# Patient Record
Sex: Female | Born: 1970 | Race: White | Hispanic: No | Marital: Married | Smoking: Never smoker
Health system: Southern US, Community
[De-identification: ages and names within clinical notes are randomized; demographics above are authoritative.]

---
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
## 2007-10-16 NOTE — Unmapped (Signed)
Signed by Danton Sewer MA on 10/17/2007 at 10:41:20    Phone Note   Patient Call    Call back at Home Phone: [phone]  Caller: patient  Call for: Thedore Mins    Follow-up for Phone Call   LVM FOR PT WILL TRY AGAIN ON THURSDAY TO REACH PT  Follow-up by: Danton Sewer MA,  October 16, 2007 4:47 PM      Additional Follow-up for Phone Call   lvm for pt can give the call ctr ladies info anything they are given is kept confidential  Phone Call Completed, Called Patient  Additional Follow-up by: Danton Sewer MA,  October 17, 2007 10:41 AM      Summary of Call:  Pt would to discuss the possibility of another blood test prior to seeing the physician...?     Initial call taken by: Marinell Blight,  October 16, 2007 10:00 AM      Follow-up for Phone Call   Pt has NPV scheduled for physical, pap and labs but theres something else she wanted to discuss and wouldn't tell me.  Follow-up by: Marinell Blight,  October 16, 2007 4:16 PM

## 2007-11-01 LAB — COMPREHENSIVE METABOLIC PANEL
A/G Ratio: 1.9 (ref 1.0–2.1)
ALT: 16 units/L (ref 6–40)
AST: 20 units/L (ref 10–30)
Albumin: 4.6 g/dL (ref 3.6–5.1)
Alkaline Phosphatase: 50 units/L (ref 33–115)
BUN: 15 mg/dL (ref 7–25)
CO2: 26 mmol/L (ref 21–33)
Calcium: 9.2 mg/dL (ref 8.6–10.2)
Chloride: 103 mmol/L (ref 98–110)
Creatinine: 1 mg/dL (ref 0.58–1.06)
GFR MDRD Af Amer: >60 mL/min (ref >=60)
GFR MDRD Non Af Amer: >60 mL/min (ref >=60)
Globulin, Total: 2.4 g/dL (ref 2.2–3.9)
Glucose: 90 mg/dL (ref 65–99)
Potassium: 4.2 mmol/L (ref 3.5–5.3)
Sodium: 137 mmol/L (ref 135–146)
Total Bilirubin: 1.6 mg/dL (ref 0.2–1.2)
Total Protein: 7 g/dL (ref 6.2–8.3)

## 2007-11-01 LAB — CBC
Hematocrit: 39.2 % (ref 35.0–45.0)
Hemoglobin: 13.8 g/dL (ref 11.7–15.5)
MCH: 32.6 pg (ref 27.0–33.0)
MCHC: 35.1 g/dL (ref 32.0–36.0)
MCV: 92.9 fL (ref 80.0–100.0)
Platelets: 273 10*3/uL (ref 140–400)
RBC: 4.22 10*6/uL (ref 3.80–5.10)
RDW: 13.4 % (ref 11.0–15.0)
WBC: 3.6 10*3/uL (ref 3.8–10.8)

## 2007-11-01 LAB — LIPID PANEL
Chol/HDL Ratio: 2.2 (ref <=5.0)
Cholesterol, Total: 149 mg/dL (ref 125–200)
HDL: 67 mg/dL (ref >=46)
LDL Cholesterol: 68 mg/dL (ref <130)
Triglycerides: 70 mg/dL (ref <150)

## 2007-11-01 NOTE — Unmapped (Signed)
Signed by Cleone Slim MD on 11/04/2007 at 16:14:15  Patient: Julia Keller  Note: All result statuses are Final unless otherwise noted.    Tests: (1) TISSUE TRANSGLUTAMINASE ANTIBODY, IGA (QIO-9629)  ! TISSUE TRANSGLUTAMINASE ANTIBODY, IGA                              <3 U/mL      VALUE              EXPLANATION OF TEST RESULTS       <5                NEGATIVE      5-8                EQUIVOCAL       >8                POSITIVE    Note: An exclamation mark (!) indicates a result that was not dispersed into   the flowsheet.  Document Creation Date: 11/04/2007 1:11 PM  _______________________________________________________________________    (1) Order result status: Final  Collection or observation date-time: 11/01/2007 09:14  Requested date-time:   Receipt date-time: 11/01/2007 09:21  Reported date-time: 11/04/2007 12:00  Referring Physician:    Ordering Physician: Melissa Montane [facility])  Specimen Source: S  Source: Arline Asp Order Number: [id number]  Lab site: [facility], [facility] [place]      [address]      [place]  [place]  [address]

## 2007-11-01 NOTE — Unmapped (Signed)
Signed by Cleone Slim MD on 11/01/2007 at 17:42:20  Patient: Julia Keller  Note: All result statuses are Final unless otherwise noted.    Tests: (1) CBC (H/H, RBC, INDICES, WBC, PLT) (QDL-1759)   WHITE BLOOD CELL COUNT                         [L]  3.6 Thousand/uL             3.8-10.8    RED BLOOD CELL COUNT      4.22 Million/uL             3.80-5.10    HEMOGLOBIN                13.8 g/dL                   62.1-30.8    HEMATOCRIT                39.2 %                      35.0-45.0    MCV                       92.9 fL                     80.0-100.0    MCH                       32.6 pg                     27.0-33.0    MCHC                      35.1 g/dL                   65.7-84.6    RDW                       13.4 %                      11.0-15.0    PLATELET COUNT            273 Thousand/uL             140-400    Note: An exclamation mark (!) indicates a result that was not dispersed into   the flowsheet.  Document Creation Date: 11/01/2007 4:45 PM  _______________________________________________________________________    (1) Order result status: Final  Collection or observation date-time: 11/01/2007 09:14  Requested date-time:   Receipt date-time: 11/01/2007 09:21  Reported date-time: 11/01/2007 16:00  Referring Physician:    Ordering Physician: Melissa Montane [facility])  Specimen Source: B  Source: Arline Asp Order Number: [id number]  Lab site: [facility], [facility] [place]      [address]      [place]  [facility]  [address]      -----------------    The following lab values were dispersed to the flowsheet  with no units conversion:      WHITE BLOOD CELL COUNT, 3.6 THOUSAND/UL, (F)  expected units: 10*3/mm3    RED BLOOD CELL COUNT, 4.22 MILLION/UL, (F)  expected units: 10*6/mm3    PLATELET COUNT, 273 THOUSAND/UL, (F)  expected units: 10*3/mm3

## 2007-11-01 NOTE — Unmapped (Signed)
Signed by Cleone Slim MD on 11/12/2007 at 10:17:31    [facility]  [address]  [place], [place] [address]  Ph: [phone]  Fax: [phone]     Nov 12, 2007      [facility]  [address]    [place], [place] [address]                                                                                           RE:  TEST RESULTS   Julia Keller--1970-10-13)         Dear [name]:      The following is an interpretation of your most recent tests.  Please take note of any instructions provided.  Pap Smear:  normal - repeat in 3  years        Glucose test normal:  90   Kidney function studies:  Normal    Liver function studies:  Normal    Lipid panel:  Normal          Triglyceride: 70   Cholesterol: 149   LDL: 68   HDL: 67   Chol/HDL%:  2.2   CBC results (Blood Counts):  Normal        Other lab results: The blood test for celiac disease was negative (which is normal)         Keep up the good work,        Cleone Slim MD

## 2007-11-01 NOTE — Unmapped (Signed)
Signed by Cleone Slim MD on 11/01/2007 at 17:42:20  Patient: Julia Keller  Note: All result statuses are Final unless otherwise noted.    Tests: (1) COMPREHENSIVE METABOLIC PANEL W/EGFR (QDL-10231)    GLUCOSE                   90 mg/dL                    24-40                  FASTING REFERENCE INTERVAL    UREA NITROGEN (BUN)       15 mg/dL                    1-02    CREATININE                1.0 mg/dL                   0.58-1.06   eGFR NON-AFR. AMERICAN                              >60 mL/min/1.84m2           > OR = 60   eGFR AFRICAN AMERICAN                              >60 mL/min/1.7m2           > OR = 60   BUN/CREATININE RATIO (calc)                              NOT APPLICABLE              6-22      BUN/CREATININE RATIO IS NOT REPORTED WHEN THE BUN      AND CREATININE VALUES ARE WITHIN NORMAL LIMITS.    SODIUM                    137 mmol/L                  135-146    POTASSIUM                 4.2 mmol/L                  3.5-5.3    CHLORIDE                  103 mmol/L                  98-110    CARBON DIOXIDE            26 mmol/L                   21-33    CALCIUM                   9.2 mg/dL                   7.2-53.6    PROTEIN, TOTAL            7.0 g/dL                    6.4-4.0    ALBUMIN  4.6 g/dL                    1.6-1.0    GLOBULIN (calc)           2.4 g/dL                    9.6-0.4   ALBUMIN/GLOBULIN RATIO (calc)                              1.9                         1.0-2.1    BILIRUBIN, TOTAL     [H]  1.6 mg/dL                   5.4-0.9    ALKALINE PHOSPHATASE      50 U/L                      33-115    AST                       20 U/L                      10-30    ALT                       16 U/L                      6-40    Note: An exclamation mark (!) indicates a result that was not dispersed into   the flowsheet.  Document Creation Date: 11/01/2007 5:32 PM  _______________________________________________________________________    (1) Order result status:  Final  Collection or observation date-time: 11/01/2007 09:14  Requested date-time:   Receipt date-time: 11/01/2007 09:21  Reported date-time: 11/01/2007 17:00  Referring Physician:    Ordering Physician: Melissa Montane [facility])  Specimen Source: S  Source: Arline Asp Order Number: [id number]  Lab site: [facility]      [address]      [place]  [place]  [address]      -----------------    The following non-numeric lab results were dispersed to  the flowsheet even though numeric results were expected:      BUN/CREATININE RATIO (calc), NOT APPLICABLE

## 2007-11-01 NOTE — Unmapped (Signed)
Signed by Cleone Slim MD on 11/01/2007 at 17:42:20  Patient: Julia Keller  Note: All result statuses are Final unless otherwise noted.    Tests: (1) LIPID PANEL WITH REFLEX TO DIRECT LDL (ZOX-09604)    TRIGLYCERIDES             70 mg/dL                    <540    CHOLESTEROL, TOTAL        149 mg/dL                   981-191    HDL CHOLESTEROL           67 mg/dL                    > OR = 46   LDL-CHOLESTEROL (calc)                              68 mg/dL                    <478             DESIRABLE RANGE <100 MG/DL FOR PATIENTS WITH CHD OR      DIABETES AND <70 MG/DL FOR DIABETIC PATIENTS WITH      KNOWN HEART DISEASE.          CHOL/HDLC RATIO (calc)                              2.2                         < OR = 5.0    Note: An exclamation mark (!) indicates a result that was not dispersed into   the flowsheet.  Document Creation Date: 11/01/2007 5:32 PM  _______________________________________________________________________    (1) Order result status: Final  Collection or observation date-time: 11/01/2007 09:14  Requested date-time:   Receipt date-time: 11/01/2007 09:21  Reported date-time: 11/01/2007 17:00  Referring Physician:    Ordering Physician: Melissa Montane [facility])  Specimen Source: S  Source: Arline Asp Order Number: [id number]  Lab site: [facility] [place]      [address]      [place]  [place]  [address]

## 2007-11-01 NOTE — Unmapped (Signed)
Signed by Cleone Slim MD on 11/01/2007 at 09:13:21      Reason for Visit   Chief Complaint: Here to be established as a new patient.    History from: patient    Allergies  No Known Allergies    Medications   Not currently on any medication    Vital Signs:   Ht: 64.25 in.  Wt: 142 lbs.      BMI: 24.27  BSA: 1.70    Temperature: 98.6  degrees F  oral  Pulse: 88 (regular)  Resp: 14    Patient appears to be in acute distress: no    Blood Pressure #1: 120/66, left arm    Intake recorded by: Sung Amabile MA on Nov 01, 2007 8:05 AM    History of Present Illness   Chief Complaint: CPE  PHYSICAL    DIET: watches caloric intake; does get ++ g.i. s/e w/ inc fruits/vegs.Marland KitchenMarland Kitchen?IBS  EXERCISE: very regular (see job)    CONCERNS: gm w/ ?ovarian/uterine cancer , would like ca-125 screening; ?IBS:  weekly: inter constipation/diarrhea; gas ++, uses otc antacids +    Past History  Past Medical History:  ?IBS  Surgical History:  Tonsillectomy: yes    Family History: MGM - ovarian/uterine ca  Social History: Marital Status: married,   Employment Status: employed full-time,   Occupation: Systems analyst  Exercise: regular!,   Alcohol Use: none  Tobacco Usage:non-smoker    Review of Systems  General: Denies fevers, weight loss.   Cardiovascular: Denies chest pains.   Respiratory: Denies dyspnea.   Gastrointestinal: Denies see HPI.   Genitourinary: Denies dysuria. reg menses  Musculoskeletal: Denies back pain, joint pain.   Psychiatric: Denies depression, anxiety.       Physical Examination:   BP: 120/  66    Physical Exam- Detail:   General Appearance: well-developed, well-nourished and in no acute distress.  Skin: No suspicious rashes or lesions.  Eyes: Sclera white, conjunctiva without injection and pallor.  PERRLA.  EOMI  Ears: No lesions.  Tympanic membranes translucent, non-bulging.  Canal walls pink, without discharge.  Hearing grossly intact.  Oropharynx: Normal appearance.  No erythema, exudate or mass. No tonsillar  swelling.  Oral Cavity: Gums pink, good dentition.  Oral mucosa and tongue without lesions.  Respiratory: Respiration un-labored.  Lung fields clear to auscultation.  No wheezing, rales, rhonchi or pleural rub.  Neck: No thyromegaly.  No nodules, masses or tenderness.  Lymphatic: Areas palpated not enlarged:  cervical, supraclavicular.  Breast: Breasts symmetrical.  No lumps, masses, discharge, tenderness or dimpling.  Chaperone: present  Initials: hmanga  Cardiac: S1 and S2 normal.  RRR without murmurs, rubs, gallops.  No JVD.  Vascular: No carotid bruits.  No edema or varicosities.  Pedal Pulse Left: dorsalis pedis +2, posterior tib +2  Pedal Pulse Right: dorsalis pedis +2,  posterior tib +2  Abdomen: No masses or tenderness. Bowel sounds active x4 quad.  Liver and spleen are without tenderness or enlargement.  No hernias.  Rectal Exam: Normal sphincter tone, no masses.   Female G/U: No external masses, lesions, scars, rashes, or swelling of vulva.  Labia, clitoris, vaginal orifice, and urethral meatus intact without discharge.    Uterus/Adnexa Uterus midline, not enlarged, non-tender, firm and smooth.  No adnexal masses or tenderness.  Cervix Cervix pink and without lesions or discharge.  Chaperone: present  Initials: hmanga  Neurologic: Cranial nerves 2 through 12 intact.   Strength is 5/5 in lower extremities bilaterally.  Psychiatric:  Judgement and insight are within normal limits.  Alert and oriented x3.  No mood disorders noted, appropriate affect.  Thoracic Spine: No misalignment, defects, or erythema.  No tenderness, crepitus or subluxation. Full ROM.  Lumbar Spine: No misalignment, defects, or erythema.  No tenderness, crepitus or subluxation. Full ROM.  Right Upper Extremity: No misalignment, defects, or erythema.  No tenderness, crepitus or subluxation. No synovial thickening or nodularity. Full ROM.  Left Upper Extremity: No misalignment, defects, or erythema.  No tenderness, crepitus or subluxation.  No synovial thickening or nodularity. Full ROM.  Right Lower Extremity: No misalignment, defects, or erythema.  No tenderness, crepitus or subluxation. No synovial thickening or nodularity. Full ROM.  Left Lower Extremity: No misalignment, defects, or erythema.  No tenderness, crepitus or subluxation. No synovial thickening or nodularity. Full ROM.           New Problems:  ABDOMINAL PAIN, CHRONIC (ICD-789.00)      Preventive Maintenance   Pap Smear Date: 06/19/2001 Pap Smear Normal       Tetanus/Td Vaccine     Vaccine Type: Tdap     Date/Time: 11/01/2007 8:03 AM     Site: left deltoid     Mfr: sanofi Pasteur     Dose: 0.5 ml     Route: IM     Given by: Sung Amabile MA     Exp. Date: 08/03/2009     Lot #: [id number]     VIS given: 12/28/04 version given Nov 01, 2007.          Assessment and Plan   [phone]    1. GMW:NUUVOZDG with healthy diet & exercise ; ck lipids; pap; d/w pt re ca-125 to screen for ovaian/uterine ca: pros & cons dscussed, adv not rec as a screening test, but order given as she has a 2nd degree relative & chronic abd pain  2. IBS?: ck labs for hfp, celiac; refer to gi; Patient education given;     Problems New Problems:  Dx of ABDOMINAL PAIN, CHRONIC (ICD-789.00)  Onset: 11/01/2007  Today's Orders   GI Consult [UYQ-03474]  CBC without Diff   (CBC) (1759) [CPT-85027]  Comp Metabolic Panel  (METAPNL) (10231) [CPT-80053]  Lipid Panel w/Reflex to Direct LDL (14852) [CPT-80061]  Transgult  IGA Antibody, Tissue  (TRANSGLU)  (8821) [CPT-83516]  CA 125  (CA125) (25956)  [CPT-86304]  Specimen Handling [CPT-99000]  Cytology, Pap / HPV Reflex  (ThinPrep) (38756) [IMS-11111]  Admin Fee Immun lst Inj. [CPT-90471]  Tdap vacc    38-39 years old  (V29.1) [EPP-29518]  84166 - Preventive, New, (18-39) [CPT-99385]    Disposition:   Return to clinic for Doctor Visit in 1 year(s)

## 2007-11-29 ENCOUNTER — Inpatient Hospital Stay

## 2007-11-29 NOTE — Unmapped (Signed)
Signed by   LinkLogic on 11/29/2007 at 14:57:18  Patient: Julia Keller  Note: All result statuses are Final unless otherwise noted.    Tests: (1) US-ABDOMEN COMPLETE (7425956)    Order NotePricilla Handler Order Number: [id number]    Order Note:     *** VERIFIED ***  [facility]  Reason:  CHRONIC ABD PAIN  Dict.Staff: Zachery Conch [phone]    Verified By: Zachery Conch       Ver: 11/29/07   2:57 pm  Exams:  US-ABDOMEN COMPLETE      US-ABDOMEN COMPLETE  Nov 29, 2007 2:05:39 PM    Clinical Indication:  CHRONIC ABD PAIN    Findings:    As ordered on the requisition, routine ultrasound of the  gallbladder, liver, pancreas, and kidneys was obtained. This is  performed as a single complete abdominal ultrasound examination.    The liver shows normal echogenicity with no evidence of  intrahepatic duct dilation. The gallbladder is free of stones  and shows a normal wall thickness. There is no evidence of  pericholecystic fluid.    Common hepatic duct measures 2 mm in diameter, within normal  limits. The pancreas and spleen show normal echogenicity. The  spleen measures 9 cm in length, within normal limits.    The kidneys are normal in size and echogenicity bilaterally,  measuring 9.6 x 3.2 cm on the right and 10.5 x 5.8 cm on the  left. There is no evidence of hydronephrosis, renal mass, or  renal calculi.    Impression:    Negative abdominal ultrasound.  **** end of result ****    Order Note:   EMR Routing to: Johney Frame, MD - ordering - [id number]    Note: An exclamation mark (!) indicates a result that was not dispersed into   the flowsheet.  Document Creation Date: 11/29/2007 2:57 PM  _______________________________________________________________________    (1) Order result status: Final  Collection or observation date-time: 11/29/2007 10:20:00  Requested date-time: 11/29/2007 10:20:00  Receipt date-time:   Reported date-time: 11/29/2007 14:57:13  Referring Physician: Cindi Carbon NON-STAFF  Ordering Physician: Langley Adie (ROODRP)  Specimen Source:   Source: QRS  Filler Order Number: [id number]  Lab site: [facility]

## 2007-11-29 NOTE — Unmapped (Signed)
Signed by   LinkLogic on 11/29/2007 at 14:57:19  Patient: Julia Keller  Note: All result statuses are Final unless otherwise noted.    Tests: (1) US-RETROPERITONEAL COMPLETE ([id number])    Order NotePricilla Handler Order Number: [id number]    Order Note:     *** VERIFIED ***  [facility]  Reason:  CHRONIC ABD PAIN  Dict.Staff: Zachery Conch [phone]    Verified By: Zachery Conch       Ver: 11/29/07   2:57 pm  Exams:  US-ABDOMEN COMPLETE      US-ABDOMEN COMPLETE  Nov 29, 2007 2:05:39 PM    Clinical Indication:  CHRONIC ABD PAIN    Findings:    As ordered on the requisition, routine ultrasound of the  gallbladder, liver, pancreas, and kidneys was obtained. This is  performed as a single complete abdominal ultrasound examination.    The liver shows normal echogenicity with no evidence of  intrahepatic duct dilation. The gallbladder is free of stones  and shows a normal wall thickness. There is no evidence of  pericholecystic fluid.    Common hepatic duct measures 2 mm in diameter, within normal  limits. The pancreas and spleen show normal echogenicity. The  spleen measures 9 cm in length, within normal limits.    The kidneys are normal in size and echogenicity bilaterally,  measuring 9.6 x 3.2 cm on the right and 10.5 x 5.8 cm on the  left. There is no evidence of hydronephrosis, renal mass, or  renal calculi.    Impression:    Negative abdominal ultrasound.  **** end of result ****    Order Note:   EMR Routing to: Johney Frame, MD - ordering - [id number]    Note: An exclamation mark (!) indicates a result that was not dispersed into   the flowsheet.  Document Creation Date: 11/29/2007 2:57 PM  _______________________________________________________________________    (1) Order result status: Final  Collection or observation date-time: 11/29/2007 10:20:00  Requested date-time: 11/29/2007 10:20:00  Receipt date-time:   Reported date-time: 11/29/2007 14:57:13  Referring Physician: Cindi Carbon NON-STAFF  Ordering Physician:  Langley Adie (ROODRP)  Specimen Source:   Source: QRS  Filler Order Number: [id number]  Lab site: [facility]

## 2007-11-29 NOTE — Unmapped (Signed)
Signed by Johney Frame MD, Caleen Essex, AGAF on 11/29/2007 at 09:17:38    [facility]  Division of Digestive Diseases        Consultation Requested By: Dr. Melissa Montane  History from: Julia Keller  Reason for visit: New Julia Keller Visit  Chief Complaint: RUQ chronic pain, after lunch Sunday pain for 3 days shoots around into back    HISTORY OF PRESENT ILLNESS  cc:    Had abd pain, chronic last week, under rt rib cage rad to back.  No dark urine.  Acute onset of pain after eating barbeque pork.  Notes that she can have similar symptoms in past not so extreme.    No GERD Sx's    Some bloating, flatus,  No change bm's.  Weight stable Appetite good.  Gallbladder intact.    VITAL SIGNS    Height: 64.25 inches    Weight: 144 lbs/ 0 kg.,    Blood Pressure: 110 / 62mm Hg,    Pulse rate: 60,    Respirations: 10,    MEDICATIONS (on Intake):        Intake recorded by:  Roanna Raider MA  November 29, 2007 7:56 AM      Past History  Past Medical History (reviewed - no changes required):  ?IBS  Surgical History (reviewed - no changes required):  Tonsillectomy: yes    Family History (reviewed - no changes required): MGM - ovarian/uterine ca  Social History (reviewed - no changes required): Marital Status: married,   Employment Status: employed full-time,   Occupation: Systems analyst  Exercise: regular!,   Alcohol Use: none  Tobacco Usage:non-smoker      REVIEW OF SYSTEMS   Health history questionnaire was reviewed by me with the Julia Keller. Review of systems were negative except as noted in H.P.I.  See scanned Review of Systems sheet    PHYSICAL EXAMINATION   Constitutional: Undetermined, female, alert, well developed, well nourished.  Skin: Normal on inspection, normal on palpation.  Head: Grossly symmetrical and asymptomatic, oropharynx and tongue normal.  Eyes: Normal inspection of eye and lids, no scleral icterus.  Ears/Nose/Mouth/Throat: Hearing grossly intact, septum midline, nares patent, healthy  dentition, no gingival inflammation lips, pharyngeal walls and tonsillar fossae without abnormalities.  Neck: Supple, no masses, no thyromegaly.  Breasts: Deferred.  Lymphatic: No evidence of lymphadenopathy in the neck, no evidence of lymphadenopathy in the supraclavicular area.  Cardiovascular: Auscultation: regular rate and rhythm, normal s1, normal s2, no rub, no murmurs, no gallops, carotid arteries: no bruits, peripheral circulation: no cyanosis, no clubbing, no lower extremity edema, no ulceration, pulses are equal and symmetrical.  Respiratory: No respiratory distress, clear to auscultation and percussion.  Gastrointestional: Abdomen: soft, tenderness, murphy's sign, tender ruq toward midline, high subcostal.  no rebound gurading or organomegaly.  Musculoskeletal: Grossly normal musculature, normal range of motion, nails: no thickening discoloration or pitting.  Neurological: No asterixis, normal gait, normal muscle strength, cn ii-xii grossly intact.  Psychiatric: Affect and mood are appropriate, alert and oriented to person-place-time.      LABS REVIEWED    Albumin: 4.6 (11/01/2007 9:14:00 AM)    Alk Phos: 50 (11/01/2007 9:14:00 AM)         AST: 20 (11/01/2007 9:14:00 AM)    ALT: 16 (11/01/2007 9:14:00 AM)    Alkaline Phosphatase: 50 (11/01/2007 9:14:00 AM)    Total Bilirubin: 1.6 (11/01/2007 9:14:00 AM)    Total Protein: 7.0 (11/01/2007 9:14:00 AM)    Hemoglobin: 13.8 (11/01/2007 9:14:00  AM)         Hematocrit: 39.2 (11/01/2007 9:14:00 AM)    MCV: 92.9 (11/01/2007 9:14:00 AM)    MCH: 32.6 (11/01/2007 9:14:00 AM)    WBC: 3.6 THOUSAND/UL (11/01/2007 9:14:00 AM)    Platelets: 273 THOUSAND/UL (11/01/2007 9:14:00 AM)    Calcium: 9.2 (11/01/2007 9:14:00 AM)         Serum Glucose: 90 (11/01/2007 9:14:00 AM)    Sodium: 137 (11/01/2007 9:14:00 AM)    Potassium: 4.2 (11/01/2007 9:14:00 AM)    Chloride: 103 (11/01/2007 9:14:00 AM)    BUN: 15 (11/01/2007 9:14:00 AM)    Creatinine: 1.0 (11/01/2007 9:14:00 AM)    Serum  Calcium: 9.2 (11/01/2007 9:14:00 AM)    Pap smear: Neg (11/01/2007 12:00:00 AM)    Triglycerides: 70 (11/01/2007 9:14:00 AM)        ASSESSMENT and PLAN   Julia Keller with acute onset of chronic abdominal pain ruq with radiation to back.  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  She is tender RUQ toward midline.  Murphy's sign present.  Also total Bili 1.6,  she denies past hx of increased bili to suspect Gilberts    Most likely dx is cholecystitis  other ddx's include:  Renal Stone  Acid peptic disease.    Plan U/S RUQ and kidneys  Liver pannel     Call for result after done.    Pending results if negative may need Hida or EGD.  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 Melissa Montane    * include labs from date of service    * include Diagnostic Study from date of service

## 2007-11-29 NOTE — Unmapped (Signed)
Signed by Johney Frame MD, Caleen Essex, AGAF on 11/29/2007 at 00:00:00  GI Review of Systems      Imported By: Coletta Memos 12/10/2007 13:59:10    _____________________________________________________________________    External Attachment:    Please see [employer] EMR for this document.

## 2007-11-29 NOTE — Unmapped (Signed)
Signed by Cleone Slim MD on 11/29/2007 at 00:00:00  Gastroenterology      Imported By: Scharlene Corn 08/12/2008 17:45:05    _____________________________________________________________________    External Attachment:    Please see [facility] EMR for this document.

## 2007-11-29 NOTE — Unmapped (Signed)
Signed by Roanna Raider MA on 11/29/2007 at 11:00:06        [facility]   [address]  [place], [place] [address]   [phone]  Fax: [phone]             November 29, 2007          RE: SHANTEE HAYNE   DOB:  09-03-1970      Dear Dr. Melissa Montane,    It was a pleasure seeing your patient, LEXY MEININGER at your request for Consultation.  Please find the evaluation of this visit below;    ASSESSMENT AND PLAN  Patient with acute onset of chronic abdominal pain ruq with radiation to back.  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  She is tender RUQ toward midline.  Murphy's sign present.  Also total Bili 1.6,  she denies past hx of increased bili to suspect Gilberts    Most likely dx is cholecystitis  other ddx's include:  Renal Stone  Acid peptic disease.    Plan U/S RUQ and kidneys  Liver pannel     Call for result after done.    Pending results if negative may need Hida or EGD.  P      I would like to thank you for allowing me to see this patient and to share in their care. If I can be of any further assistance, please feel free to contact me.       Best personal regards,    Sincerely,       Tiron Suski P. Andi Hence, MD, Caleen Essex, AGAF  Associate Professor of Clinical Medicine  Department of Internal Medicine  Division of Digestive Diseases

## 2007-12-02 NOTE — Unmapped (Signed)
Signed by Wilhemina Bonito RN on 12/03/2007 at 09:02:08    Phone Note   Patient Call    Call back at Home Phone: [phone]  Caller: patient  Department: IM -Gastroenterology  Call for: Vickey Ewbank    Follow-up for Phone Call   patient leaving town at noon needs results called ASAP  Called Patient, Provider Notified  Follow-up by: Roanna Raider MA,  December 02, 2007 10:52 AM      Additional Follow-up for Phone Call   Pt called @ [facility]. Upset because had not heard results yet. I discussed with her [place] normal. Still symptomatic. Will schedule follow up appt when returns to town.  Additional Follow-up by: Wilhemina Bonito RN,  December 03, 2007 9:02 AM    Reason for Call: speak with nurse, test results    Summary of Call:  wanting to know ultra sound results from friday 06.12.09.      Initial call taken by: Schuyler Amor,  December 02, 2007 9:05 AM

## 2008-02-19 NOTE — Unmapped (Signed)
Signed by Roanna Raider MA on 02/19/2008 at 15:20:13    Phone Note   Patient Call    Call back at Home Phone: [phone]  Caller: patient  Department: IM -Gastroenterology  Call for: Dorothie Wah    Follow-up for Phone Call   spoke with Doran Stabler okay to see Dr Ottie Tillery   Phone Call Completed, Appt Scheduled, Called Patient  Follow-up by: Roanna Raider MA,  February 19, 2008 3:19 PM      Reason for Call: speak with nurse    Summary of Call:  pt had seen dr. Andi Hence and feels that he is not the person to talk to for what she is experiencing, she would like to see another g.i. doctor perhaps dr. Mickie Bail due to thats who her pcp recommended to her for a second option and that he may be the best one for her to see, pt would like to discusss our doctor's to see which one would suit her needs best. ( she can not come in tue or thursdays)     Initial call taken by: Schuyler Amor,  February 19, 2008 1:14 PM

## 2008-02-19 NOTE — Unmapped (Signed)
Signed by Sung Amabile MA on 02/19/2008 at 13:05:00    Phone Note   Patient Call    [facility] Cell Phone #: [phone]     Calling Instructions: Call cell first  Caller: patient  Department: Family Medicine  Call for: Thedore Mins    Follow-up for Phone Call   the note from dr rood in june was that she was suppsoed to follow up with him might need EGD  she shd call gi & make a fu appt w/ dr Andi Hence  Follow-up by: Cleone Slim MD,  February 19, 2008 12:33 PM      Additional Follow-up for Phone Call   Patient informed.  Additional Follow-up by: Sung Amabile MA,  February 19, 2008 1:05 PM    Reason for Call: test results    Summary of Call:  Blood work was done back in March.  Dr. Thedore Mins referred pt to a GI, after several ultrasounds done, still are no findings to the stomach problem, pt still is in pain.  PT would like to know if an Ulcer is possible at this time.  DId the blook work show any information on that, or what should she do next?     Initial call taken by: Cyril Loosen,  February 19, 2008 10:07 AM

## 2008-03-09 NOTE — Unmapped (Signed)
Signed by Lurene Shadow MD on 03/09/2008 at 00:00:00  Intake      Imported By: Coletta Memos 03/12/2008 15:39:44    _____________________________________________________________________    External Attachment:    Please see [employer] EMR for this document.

## 2008-03-09 NOTE — Unmapped (Signed)
Signed by Lurene Shadow MD on 03/09/2008 at 16:59:51    [facility]  Division of Digestive Diseases        Consultation Requested By: Dr. Melissa Montane  Chief Complaint: RUQ chronic pain, after lunch Sunday pain for 3 days shoots around into back    HISTORY OF PRESENT ILLNESS  I am seeing Julia Keller as second opinion regarding RUQ pain. Julia Keller has had about 6 months now of recurrent RUQ pains about 4 x per week. She describes sharp pains that come and go and sometimes will travel to other parts of the abdomen but not to back or chest. She has not had nausea, voting, diarrhea, rectal bleeding, heartburn or prior hx of IBD or PUD. Recent RUQ Korea was normal.   She had been using some NSAIDs which she stopped out of concernh for pUD, and did start taking prilosec daily which she thinks helped a little bit, but not too much. 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  She ran 1/2 marathon here in [place] [employer]) inMay and during latter portions of that race had very significant enduring pain which prompted initial medical opinion. she tells me that if she does her usal 3-45mile runs, she tends not to have problems, but does get pains started in latter portions of long runs which she does once per week. Sometimes she will notice bloating/distention during symptomatic periods which will last to hours.   She used ot have problems with constipation which got better when she strated using an OTC probiotic called restore which she still uses. No other meds now besides the prilosec.   No weight loss.    Patient lives with husband Orthoptist), 17y/o son who is a Fish farm manager and wants to go to [employer], and 23year-old daughter.  Patient confesses to typical family, professional and financial stressors.  FHx: noncontributory.  PE: NAD, VSS; no pain now  anicteric with no  rahses; neck supple with no mass/goiter/LAD  lungs cta; ht reg  abd- soft, NTND(+)BS with no HSM/mass  extr- no c/c/e  MS- normal and appropriate    VITAL SIGNS    Height: 64.25 inches    Weight: 145 lbs/ 65.77 kg.    BMI (in-lb): 24.79,    Blood Pressure: 120 / 70mm Hg,    Pulse rate: 78,    Respirations: 16,    MEDICATIONS (on Intake):    [employer] DAILY PROBIOTIC  CAPS (MISC INTESTINAL FLORA REGULAT) 1po daily  PRILOSEC OTC  TBEC (OMEPRAZOLE MAGNESIUM TBEC) 1 by mouth daily  TUMS  CHEW (CALCIUM CARBONATE ANTACID CHEW) as needed      Intake recorded by:  Rosalio Loud Ridner MA  March 09, 2008 2:58 PM      Past History  Past Medical History (reviewed - no changes required):  ?IBS  Surgical History (reviewed - no changes required):  Tonsillectomy: yes    Family History (reviewed - no changes required): MGM - ovarian/uterine ca  Social History (reviewed - no changes required): Marital Status: married,   Employment Status: employed full-time,   Occupation: Systems analyst  Exercise: regular!,   Alcohol Use: none  Tobacco Usage:non-smoker      REVIEW OF SYSTEMS   See scanned Review of Systems sheet            ASSESSMENT and PLAN   Recurrent RUQ pain with  timing and description suggestive of colonic distention which could either be due to constipation or bacterial overgrowth.  (Antacids would act as bacteriocidal to some bacteria and thus act in antibiotic fashion to limit/change intestinal gas.)  First will empirically try laxatives, given hx of mild constipation, and asked her to use 1-1.5glasses per day of apple juice, or to try green tea, or to use amitizia per day, for which I provded samples. I advised that she titrate any of these laxatives to affect better bowel output and to see effcet on symptoms. If this does nt work, would change her probiotics.  RTO 2 weeks to discuss symptoms changes on laxatives.    Problems  CONSTIPATION NOS (ICD-564.00)                    CC:   Dr. Melissa Montane

## 2008-03-09 NOTE — Unmapped (Signed)
Signed by Roanna Raider MA on 03/09/2008 at 17:02:48        [facility] - [facility]astroenterology[facility]   [address]  [place], [place] [address]   [phone]  Fax: [phone]             March 09, 2008          RE: Julia Keller   DOB:  1970/10/12      Dear Dr. Melissa Montane,    It was a pleasure seeing your patient, Julia Keller at your request for Consultation.  Please find the evaluation of this visit below;    ASSESSMENT AND PLAN  Recurrent RUQ pain with timing and description suggestive of colonic distention which could either be due to constipation or bacterial overgrowth.  (Antacids would act as bacteriocidal to some bacteria and thus act in antibiotic fashion to limit/change intestinal gas.)  First will empirically try laxatives, given hx of mild constipation, and asked her to use 1-1.5glasses per day of apple juice, or to try green tea, or to use amitizia per day, for which I provded samples. I advised that she titrate any of these laxatives to affect better bowel output and to see effcet on symptoms. If this does nt work, would change her probiotics.  RTO 2 weeks to discuss symptoms changes on laxatives.      I would like to thank you for allowing me to see this patient and to share in their care. If I can be of any further assistance, please feel free to contact me.       Best personal regards,    Lurene Shadow, MD  Associate Professor of Medicine, Digestive Diseases

## 2008-03-23 NOTE — Unmapped (Signed)
Signed by Lurene Shadow MD on 03/23/2008 at 15:50:33    [facility]  Division of Digestive Diseases        Consultation Requested By: Dr. Melissa Montane  Chief Complaint: RUQ chronic pain, after lunch Sunday pain for 3 days shoots around into back    HISTORY OF PRESENT ILLNESS  Ms. Hornbaker returns for second visit.  RUQ pains are dramatically better after starting amitizia by mouth daily as laxative, however she is having 2-3 loose BMs every morning.  No urgency, incontinence or bleeding, and she has no furtehr BMs after 10:30AM or so.  Additionally she has doen 2 long runs since last visit, an acitvity which redproducibly would cause RUQ pain, and she had no problems.  Exam:  unchanged for previous.    VITAL SIGNS    Height: 64.25 inches    Weight: 145 lbs/ 65.77 kg.    BMI (in-lb): 24.79,    Blood Pressure: 104 / 66mm Hg,    Pulse rate: 78,    Respirations: 14,    MEDICATIONS (on Intake):    CVS DAILY PROBIOTIC  CAPS (MISC INTESTINAL FLORA REGULAT) 1po daily  PRILOSEC OTC  TBEC (OMEPRAZOLE MAGNESIUM TBEC) 1 by mouth daily  TUMS  CHEW (CALCIUM CARBONATE ANTACID CHEW) as needed  AMITIZA  CAPS (LUBIPROSTONE CAPS) 1 by mouth daily      Intake recorded by:  Roanna Raider MA  March 23, 2008 2:49 PM      Past History  Past Medical History (reviewed - no changes required):  ?IBS  Surgical History (reviewed - no changes required):  Tonsillectomy: yes    Family History (reviewed - no changes required): MGM - ovarian/uterine ca  Social History (reviewed - no changes required): Marital Status: married,   Employment Status: employed full-time,   Occupation: Systems analyst  Exercise: regular!,   Alcohol Use: none  Tobacco Usage:non-smoker      REVIEW OF SYSTEMS   See scanned Review of Systems sheet            ASSESSMENT and PLAN   Pain much better, with only one mild event since last visit, as opposed to 4+ events per week previously.  However, she does have 2-3 loose BMs per  day.  This could be due to overlaxation or due to overflow diarrhea related to persistence of fecal loading in right colon with adequate flow around it to reduce symptoms, but not allowing for solid stool formation which on laxatives.  At this time I have asked her to cut amitizia to every other day   and we will get a KUB (Xray) to ensure that there is no fecal loading in right colon that would require bowel prep to clear. I will call her after I see her xray.  Today's Orders   X-ray, KUB [[id number]]                    CC:   Dr. Melissa Montane              ]

## 2008-03-23 NOTE — Unmapped (Signed)
Signed by Lurene Shadow MD on 03/23/2008 at 00:00:00  GI Review of Systems      Imported By: Coletta Memos 04/15/2008 14:57:48    _____________________________________________________________________    External Attachment:    Please see [employer] EMR for this document.

## 2008-03-23 NOTE — Unmapped (Signed)
Signed by Roanna Raider MA on 03/23/2008 at 17:09:12        [facility] - Gastroenterology[facility]   [address]  [place], [place] [address]   [phone]  Fax: [phone]             March 23, 2008          RE: Julia Keller   DOB:  [birth year]      Dear Dr. Melissa Montane,    I recently saw Julia Keller in the office.  Please see my Assessment and Plan below. My complete office note is available at your request.  Should you have any further questions please do not hesitate to contact me.    ASSESSMENT AND PLAN  Pain much better, with only one mild event since last visit, as opposed to 4+ events per week previously.  However, she does have 2-3 loose BMs per day.  This could be due to overlaxation or due to overflow diarrhea related to persistence of fecal loading in right colon with adequate flow around it to reduce symptoms, but not allowing for solid stool formation which on laxatives.  At this time I have asked her to cut amitizia to every other day   and we will get a KUB (Xray) to ensure that there is no fecal loading in right colon that would require bowel prep to clear. I will call her after I see her xray.      Thank you for allowing me to see this patient.  I will keep you informed of their progress.       Best personal regards,      Lurene Shadow, MD  Associate Professor of Medicine, Digestive Diseases

## 2008-03-24 ENCOUNTER — Inpatient Hospital Stay

## 2008-03-24 NOTE — Unmapped (Signed)
Signed by   [employer] on 03/24/2008 at 10:51:35  Patient: Julia Keller  Note: All result statuses are Final unless otherwise noted.    Tests: (1) DIAG-ABDOMEN-AP VIEW ([id number])    Order NotePricilla Handler Order Number: [id number]    Order Note:     *** VERIFIED ***  [facility]  Reason:  CONSITPATION NOS  Dict.Staff: Lum Babe [id number]    Verified By: Lum Babe    Ver: 03/24/08  10:51 am  Exams:  DIAG-ABDOMEN-AP VIEW      AP supine abdomen, one view on March 23, 2008.    Indication: Constipation.    Findings:    The bowel gas pattern is normal. There is not a large amount of  colonic stool. The bones appear normal. No abnormal  calcifications are seen.    Impression:    Negative abdominal radiograph.  **** end of result ****    Order Note:   EMR Routing to: Lurene Shadow, MD - ordering - [id number]    Note: An exclamation mark (!) indicates a result that was not dispersed into   the flowsheet.  Document Creation Date: 03/24/2008 10:51 AM  _______________________________________________________________________    (1) Order result status: Final  Collection or observation date-time: 03/24/2008 07:56:00  Requested date-time: 03/24/2008 07:56:00  Receipt date-time:   Reported date-time: 03/24/2008 10:51:37  Referring Physician: Cindi Carbon NON-STAFF  Ordering Physician: Lurene Shadow [facility])  Specimen Source:   Source: QRS  Filler Order Number: [id number]  Lab site: [facility]

## 2008-03-24 NOTE — Unmapped (Signed)
Signed by Lurene Shadow MD on 03/24/2008 at 19:58:34    PHONE NOTE - Outgoing Call      Call placed by: Lurene Shadow MD,  March 24, 2008 7:56 PM  Call placed to: Patient  Reason for Call: discuss lab or test results  Summary of call: I have reviewed her KUB and indeed she does have a fair amount of solid stool of the right side. I do suspect she needs full prep and then start over with every other day amitizia. She prefers to do bowel prep next week. Can't rememebr her pharmacy # now. Will have JG call her tomorrow. -NS

## 2008-03-25 NOTE — Unmapped (Signed)
Signed by Angelina Pih MPAS, PA-C on 03/25/2008 at 15:54:01    PHONE NOTE - Patient Call      Reason for Call: Called patient to get pharmacy number [phone]).  Will call in Gatorade prep d/t taste.  Patient aware. Will schedule f/u 4-6 weeks.  Does question use of colon cleanse 10 day program - probiotics and fiber.  OK to try, but did encourage her to utilize Gatorade prep. Would have concerns that increased fiber may increase her pain d/t bloating.       Initial call taken by: Angelina Pih MPAS, PA-C,  March 25, 2008 3:53 PM

## 2008-03-30 NOTE — Unmapped (Signed)
Signed by Angelina Pih MPAS, PA-C on 03/31/2008 at 17:27:39    Phone Note   Patient Call    Call back at Home Phone: [phone]  Caller: patient  Department: IM -Gastroenterology  Call for: Afshin Chrystal    Follow-up for Phone Call   Used a bowel prep yesterday 24 hours,  Still has the same pain in her side.  Had a good BM liquid.  Towards the end was clear.  Today feels full, exactly like before, the pinching pain, right side.  Wants to know if we are going in the right direction?  Advised will flag Doran Stabler and get back to her with the next step  Called Patient  Follow-up by: Roanna Raider MA,  March 30, 2008 3:53 PM      Additional Follow-up for Phone Call   Left message for patient.  Additional Follow-up by: Angelina Pih MPAS, PA-C,  March 31, 2008 12:25 PM    Additional Follow-up for Phone Call   Did bowel prep Sunday - Reports all liquid stool x a lot of stools. Took about 3 hours before it started. No solid aspect. Did not run completely clear.  Will restart daily Amitiza and consider bowel prep next week if pain persists.  Additional Follow-up by: Angelina Pih MPAS, PA-C,  March 31, 2008 5:12 PM    Reason for Call: speak with nurse    Summary of Call:  pt went ahead and did the cleanse that dr.Lennox Dolberry prescribed and it hasn't really helped.       Initial call taken by: Schuyler Amor,  March 30, 2008 3:15 PM

## 2008-04-08 NOTE — Unmapped (Signed)
Signed by Angelina Pih MPAS, PA-C on 04/08/2008 at 17:33:03    PHONE NOTE - Patient Call      Reason for Call: Left message to f/u.      Initial call taken by: Angelina Pih MPAS, PA-C,  April 08, 2008 4:08 PM      FOLLOW UP  2 probiotics in morning and Amitiza after lunch (mid-afternoon).  Pain resolved as of Sunday and having daily normal stools, though did have some discomfort in RUQ w/ run today (though reports that she feels it move).  Has adjusted diet to include and V8s.  Probiotics w/ lactobacillus, bifidoactirium?, misc other ingredients.  May try another probiotic in meantime and RTC 3 weeks.  Follow-up by: Angelina Pih MPAS, PA-C,  April 08, 2008 5:33 PM

## 2008-04-27 NOTE — Unmapped (Signed)
Signed by Lurene Shadow MD on 04/27/2008 at 00:00:00  GI Review of Systems      Imported By: Coletta Memos 05/05/2008 09:33:31    _____________________________________________________________________    External Attachment:    Please see [facility] EMR for this document.

## 2008-04-27 NOTE — Unmapped (Signed)
Signed by Rosalio Loud Ridner MA on 04/27/2008 at 16:23:15        [facility] - Gastroenter[facility]   [address]  [place], [place] [address]   [phone]  Fax: [phone]             April 27, 2008          RE: SHAMAINE MULKERN   DOB:  Oct 22, 1970      Dear Dr. Melissa Montane,    I recently saw Julia Keller in the office.  Please see my Assessment and Plan below. My complete office note is available at your request.  Should you have any further questions please do not hesitate to contact me.    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 Obviously did not do well with amitizia.  Ask to see her KUB, which we reviewed together, and asks whether she needs to be reprepped, and I don't think so at this time.  Start miralax 1 scoop (17G) per day, and discussed safety, mechanism of action and titration patterns with her.  RTO 3 weeks.      Thank you for allowing me to see this patient.  I will keep you informed of their progress.       Best personal regards,      Lurene Shadow, MD  Associate Professor of Medicine, Digestive Diseases

## 2008-04-27 NOTE — Unmapped (Signed)
Signed by Lurene Shadow MD on 04/27/2008 at 13:42:19    [facility]  Division of Digestive Diseases        Consultation Requested By: Dr. Melissa Montane  Chief Complaint: RUQ chronic pain, after lunch Sunday pain for 3 days shoots around into back    HISTORY OF PRESENT ILLNESS  Patient with RUQ pains and bloating, now s/p a bowel prep which sounded productive but did not appear to help her symptoms at all.  and recently on daily amitizia whcih made her thirsty all the time and interrupted sleep. Stopped the amitizia due to these dehydration issues, and because she was training for a marathon, which she just completed (albeit with slower times than previous and slower than she had hoped, which she partially blames on her recent abd. symptoms). Also felt retrospectively that back pain got better when stopped amitizia.  Then she started probiotics: align and [place]....  Currently seems to be doing better, with occasional RUQ pains which get better with flatus.  BMs now every day - 3-4 x per day, but all small and nonproductive.  No nausea, vomiting, bleeding.    PE:  NAD  Afeb, VSS  otherwise unchanged.    VITAL SIGNS    Height: 64.25 inches    Weight: 144 lbs/ 65.32 kg.    BMI (in-lb): 24.61,    Blood Pressure: 122 / 64mm Hg,    Pulse rate: 80,    Respirations: 14,    MEDICATIONS (on Intake):    [facility] DAILY PROBIOTIC  CAPS (MISC INTESTINAL FLORA REGULAT) 1po daily  TUMS  CHEW (CALCIUM CARBONATE ANTACID CHEW) as needed      Intake recorded by:  Roanna Raider MA  April 27, 2008 1:04 PM      Past History  Past Medical History (reviewed - no changes required):  ?IBS  Surgical History (reviewed - no changes required):  Tonsillectomy: yes    Family History (reviewed - no changes required): MGM - ovarian/uterine ca  Social History (reviewed - no changes required): Marital Status: married,   Employment Status: employed full-time,   Occupation: Systems analyst  Exercise: regular!,   Alcohol  Use: none  Tobacco Usage:non-smoker      REVIEW OF SYSTEMS   See scanned Review of Systems sheet            ASSESSMENT and PLAN   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 Obviously did not do well with amitizia.  Ask to see her KUB, which we reviewed together, and asks whether she needs to be reprepped, and I don't think so at this time.  Start miralax 1 scoop (17G) per day, and discussed safety, mechanism of action and titration patterns with her.  RTO 3 weeks.  Medications Stopped or Removed  at this visit:  PRILOSEC OTC  TBEC (OMEPRAZOLE MAGNESIUM TBEC) 1 by mouth daily  AMITIZA  CAPS (LUBIPROSTONE CAPS) 1 by mouth daily                    CC:   Dr. Melissa Montane              ]

## 2008-04-27 NOTE — Unmapped (Addendum)
Signed by Rosalio Loud Ridner MA on 04/27/2008 at 13:53:02        [facility]astroenter[facility]   [address]  [place], [place] [address]   [phone]  Fax: [phone]             April 27, 2008          RE: Julia Keller   DOB:  Feb 28, 1971      Dear Dr. Melissa Montane,    I recently saw Julia Keller in the office.  Please see my Assessment and Plan below. My complete office note is available at your request.  Should you have any further questions please do not hesitate to contact me.    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 Obviously did not do well with amitizia.  Ask to see her KUB, which we reviewed together, and asks whether she needs to be reprepped, and I don't think so at this time.  Start miralax 1 scoop (17G) per day, and discussed safety, mechanism of action and titration patterns with her.  RTO 3 weeks.      Thank you for allowing me to see this patient.  I will keep you informed of their progress.       Best personal regards,      Angelina Pih, Danny Lawless, PA-C  Physician Assistant  Signed by Neva Seat on 04/27/2008 at 15:16:48    FIE per Jamesetta So, wrong provider

## 2008-07-06 ENCOUNTER — Inpatient Hospital Stay

## 2009-02-11 NOTE — Unmapped (Signed)
Signed by Peggye Fothergill MD on 02/14/2009 at 13:31:39      Reason for Visit   Chief Complaint: 3 week h/o pimple-like bump on her chest.    History from: patient    Allergies  No Known Allergies    Medications     Vital Signs:   Wt: 140 lbs.      BMI: 23.93  BSA: 1.69  Wt chg (lbs): -4  Temperature: 98.0  degrees F Pulse: 72  BP: 110/72        History of Present Illness - Lesion Type #1   Chief Complaint: chest lesion  Onset: sudden  Duration: 3 week(s)  Comments: no changing  color or  size    Associated Symptoms:   Complains of: bleeding  Denies: burning, discharge, fever, painful, pruritis  Timing of Symptoms: frequent  Severity: mild    Modifying Factors:   What makes it better? none  What makes it worse? unknown    PAST HISTORY  Past Medical History (reviewed - no changes required):  ?IBS  Surgical History (reviewed - no changes required):  Tonsillectomy: yes    Family History: no family history of  skin cancer  MGM - ovarian/uterine ca  Social History (reviewed - no changes required): Marital Status: married,   Employment Status: employed full-time,   Occupation: Systems analyst  Exercise: regular!,   Alcohol Use: none  Tobacco Usage:non-smoker    Review of Systems  Refer to HPI for review of systems documentation.      Physical Examination:   BP: 110/  72    Physical Exam- Detail:   General Appearance: well-developed, well-nourished and in no acute distress.  Skin: translucent papule in chest with minimal erythema  of the surounding with fine blood  vessels  on top  Respiratory: Respiration un-labored.  Lung fields clear to auscultation.  No wheezing, rales, rhonchi or pleural rub.  Cardiac: S1 and S2 normal.  RRR without murmurs, rubs, gallops.  No JVD.           New Problems:  SKIN LESION (ICD-709.9)      Preventive Maintenance       Coordinating Care Providers   PCP Name: Dr. Melissa Montane              Assessment and Plan   Poss  absal cell referred  to Derm  pt  reassured     Problems New Problems:  Dx of  SKIN LESION (ICD-709.9)  Onset: 02/11/2009  Today's Orders   Dermatology Consultation [APC-[id number]]  99213 - Ofc Vst, Est Level III [[id number]]    Disposition:   as needed

## 2009-02-12 NOTE — Unmapped (Signed)
Signed by Sung Amabile MA on 02/16/2009 at 15:29:19    Phone Note   Patient Call    Call back at Home Phone: [phone]  Caller: patient  Department: Family Medicine  Call for: [facility]     Follow-up for Phone Call   please a sk pt to call Dr Canary Brim in [place] or  Dr Moises Blood.it is ok to wait  until 03/10/09,  Follow-up by: Peggye Fothergill MD,  February 15, 2009 2:48 PM      Additional Follow-up for Phone Call   Patient informed per the doctor's note but she said she already found a different dermatologist.  Additional Follow-up by: Sung Amabile MA,  February 16, 2009 3:29 PM    Reason for call: Pt was referred to [facility] by Dr August Saucer.  Dr August Saucer informed the patient if not with in two weeks to call her.  Dr. August Saucer was going to try to have the patient seen sooner.    Pt is scheduled for 09.22.10 @ 10:45      Initial call taken by: Elberta Fortis,  February 12, 2009 8:21 AM

## 2012-06-24 ENCOUNTER — Ambulatory Visit: Admit: 2012-06-24 | Payer: PRIVATE HEALTH INSURANCE

## 2012-06-24 DIAGNOSIS — R509 Fever, unspecified: Secondary | ICD-10-CM

## 2012-06-24 NOTE — Unmapped (Signed)
Subjective  HPI:   Patient ID: Julia Keller is a 42 y.o. female.    Chief Complaint:  HPI       Sick since last night   Temp this morning 102   cough  Is dry   No chest pain or inc work of breathing  Headache since yesterday  Cough in back of he rthroat  No known sick contact   She has taken day quil this mornign  With some relief  Headache around eyes and cheelk bones and he rjaw  No gi sx  appettie low  Pt did not get flu shot this yr  History reviewed. No pertinent past medical history.  History reviewed. No pertinent past surgical history.  History     Social History   ??? Marital Status: Married     Spouse Name: N/A     Number of Children: N/A   ??? Years of Education: N/A     Occupational History   ??? Not on file.     Social History Main Topics   ??? Smoking status: Never Smoker    ??? Smokeless tobacco: Not on file   ??? Alcohol Use: Not on file   ??? Drug Use: Not on file   ??? Sexually Active: Not on file     Other Topics Concern   ??? Not on file     Social History Narrative   ??? No narrative on file     No family history on file.    Medications:  No current outpatient prescriptions on file.     No current facility-administered medications for this visit.        ROS:   Review of Systems   Constitutional: Positive for fever. Negative for unexpected weight change.   HENT: Positive for congestion, rhinorrhea and postnasal drip.    Respiratory: Positive for cough. Negative for shortness of breath and wheezing.    Cardiovascular: Negative for chest pain.          Objective:   Physical Exam   Constitutional: She is oriented to person, place, and time. She appears well-developed and well-nourished.   HENT:   Head: Normocephalic and atraumatic.   Right Ear: External ear normal.   Left Ear: External ear normal.   Nose: Nose normal.   Mouth/Throat: Oropharynx is clear and moist. No oropharyngeal exudate.   Eyes: Conjunctivae and EOM are normal. Pupils are equal, round, and reactive to light. No scleral icterus.   Neck: Normal range  of motion. Neck supple.   Cardiovascular: Normal rate, regular rhythm, normal heart sounds and intact distal pulses.    No murmur heard.  Pulmonary/Chest: Effort normal and breath sounds normal.   Abdominal: Soft.   Lymphadenopathy:     She has no cervical adenopathy.   Neurological: She is alert and oriented to person, place, and time.   Psychiatric: She has a normal mood and affect. Her behavior is normal.             Filed Vitals:    06/24/12 1218   BP: 98/62   Pulse: 101   Temp: 99.1 ??F (37.3 ??C)   TempSrc: Oral   Height: 5' 2 (1.575 m)   Weight: 147 lb (66.679 kg)   SpO2: 97%     There is no height or weight on file to calculate BMI.  There is no height or weight on file to calculate BSA.  Assessment/Plan:     Fever with cough with ur sx   flu test neg   see pt instructions

## 2012-06-24 NOTE — Unmapped (Signed)
Fluids  advil for fever and pain    otc cough meds   call if sx no better or worsening.

## 2013-05-27 ENCOUNTER — Inpatient Hospital Stay
Admit: 2013-05-27 | Discharge: 2013-05-28 | Disposition: A | Discharge status: Home / Self Care | Payer: PRIVATE HEALTH INSURANCE

## 2013-05-27 ENCOUNTER — Emergency Department: Admit: 2013-05-27 | Payer: PRIVATE HEALTH INSURANCE

## 2013-05-27 DIAGNOSIS — N201 Calculus of ureter: Secondary | ICD-10-CM

## 2013-05-27 LAB — URINALYSIS W/RFL TO MICROSCOPIC
Bilirubin, UA: NEGATIVE
Glucose, UA: NEGATIVE mg/dL
Ketones, UA: 20 mg/dL
Leukocytes, UA: NEGATIVE
Nitrite, UA: NEGATIVE
Protein, UA: 30 mg/dL
RBC, UA: 70 /HPF (ref 0–3)
Specific Gravity, UA: 1.023 (ref 1.005–1.035)
Squam Epithel, UA: 1 /HPF (ref 0–5)
Urobilinogen, UA: <2 mg/dL (ref 0.2–1.9)
WBC, UA: 7 /HPF (ref 0–5)
pH, UA: 6 (ref 5.0–8.0)

## 2013-05-27 LAB — BASIC METABOLIC PANEL
Anion Gap: 8 mmol/L (ref 3–16)
BUN: 18 mg/dL (ref 7–25)
CO2: 28 mmol/L (ref 21–33)
Calcium: 9 mg/dL (ref 8.6–10.2)
Chloride: 99 mmol/L (ref 98–110)
Creatinine: 0.89 mg/dL (ref 0.50–1.20)
GFR MDRD Af Amer: 84 See note.
GFR MDRD Non Af Amer: 69 See note.
Glucose: 100 mg/dL (ref 65–99)
Osmolality, Calculated: 282 mOsm/kg (ref 278–305)
Potassium: 3.9 mmol/L (ref 3.5–5.3)
Sodium: 135 mmol/L (ref 135–146)

## 2013-05-27 LAB — HCG URINE, QUALITATIVE: Preg Test, Ur: NEGATIVE

## 2013-05-27 MED ORDER — tamsulosin (FLOMAX) capsule Cp24 0.4 mg
0.4 | Freq: Once | ORAL | Status: AC
Start: 2013-05-27 — End: 2013-05-27
  Administered 2013-05-28: 01:00:00 via ORAL

## 2013-05-27 MED ORDER — ondansetron (ZOFRAN) 4 mg/2 mL injection 4 mg
4 | Freq: Once | INTRAMUSCULAR | Status: AC
Start: 2013-05-27 — End: 2013-05-27
  Administered 2013-05-27: via INTRAVENOUS

## 2013-05-27 MED ORDER — sodium chloride 0.9 % 1,000 mL bolus
Freq: Once | INTRAVENOUS | Status: AC
Start: 2013-05-27 — End: 2013-05-27
  Administered 2013-05-27: via INTRAVENOUS

## 2013-05-27 MED ORDER — oxyCODONE (ROXICODONE) immediate release tablet 5 mg
5 | Freq: Once | ORAL | Status: AC
Start: 2013-05-27 — End: 2013-05-27
  Administered 2013-05-28: 01:00:00 via ORAL

## 2013-05-27 MED ORDER — morphine injection 4 mg
4 | Freq: Once | INTRAMUSCULAR | Status: AC
Start: 2013-05-27 — End: 2013-05-27
  Administered 2013-05-28: via INTRAVENOUS

## 2013-05-27 MED ORDER — ondansetron (ZOFRAN ODT) 4 MG disintegrating tablet
4 | ORAL_TABLET | Freq: Four times a day (QID) | ORAL | Status: AC | PRN
Start: 2013-05-27 — End: ?

## 2013-05-27 MED ORDER — ketorolac (TORADOL) injection 15 mg
15 | Freq: Once | INTRAMUSCULAR | Status: AC
Start: 2013-05-27 — End: 2013-05-27
  Administered 2013-05-27: via INTRAVENOUS

## 2013-05-27 MED ORDER — tamsulosin (FLOMAX) 0.4 mg Cp24
0.4 | ORAL_CAPSULE | Freq: Every evening | ORAL | Status: AC
Start: 2013-05-27 — End: 2013-06-03

## 2013-05-27 MED ORDER — oxyCODONE-acetaminophen (PERCOCET) 5-325 mg per tablet
5-325 | ORAL_TABLET | Freq: Four times a day (QID) | ORAL | Status: AC | PRN
Start: 2013-05-27 — End: ?

## 2013-05-27 MED FILL — TAMSULOSIN 0.4 MG CAPSULE: 0.4 0.4 mg | ORAL | Qty: 1

## 2013-05-27 MED FILL — ONDANSETRON HCL (PF) 4 MG/2 ML INJECTION SOLUTION: 4 4 mg/2 mL | INTRAMUSCULAR | Qty: 2

## 2013-05-27 MED FILL — MORPHINE 4 MG/ML INJECTION SYRINGE: 4 4 mg/mL | INTRAMUSCULAR | Qty: 1

## 2013-05-27 MED FILL — OXYCODONE 5 MG TABLET: 5 5 MG | ORAL | Qty: 1

## 2013-05-27 MED FILL — SODIUM CHLORIDE 0.9 % INTRAVENOUS SOLUTION: INTRAVENOUS | Qty: 1000

## 2013-05-27 MED FILL — KETOROLAC 15 MG/ML INJECTION SOLUTION: 15 15 mg/mL | INTRAMUSCULAR | Qty: 1

## 2013-05-27 NOTE — Unmapped (Signed)
[facility] ED Note    Date of service:  05/27/2013    Reason for Visit: Flank Pain      Patient History     HPI  Typically healthy 42 year old presents with acute onset of left mid back and left flank pain at 2:00 this afternoon.  She reports to be colicky in nature and severe in intensity, reporting she would rather be in labor.  She has never had anything like it before.  She says her urine looks a little cloudy.  No bowel complaints.  She is mildly nauseous without vomiting.  No fevers or chills.  She denies pregnancy.  No chest pain or shortness of breath.   History reviewed. No pertinent past medical history.    Past Surgical History   Procedure Laterality Date   ??? Tonsillectomy         Patient  reports that she has never smoked. She does not have any smokeless tobacco history on file. She reports that she does not drink alcohol or use illicit drugs.  Family history: Patient's mother with history of kidney stones.    Previous Medications    No medications on file       Allergies:   Allergies as of 05/27/2013   ??? (No Known Allergies)       Review of Systems     Review of Systems    Pertinent review systems as above or is otherwise negative    Physical Exam     ED Triage Vitals   Vital Signs Group      Temp 05/27/13 1725 98.3 ??F (36.8 ??C)      Temp Source 05/27/13 1725 Oral      Heart Rate 05/27/13 1725 82      Heart Rate Source 05/27/13 1725 Monitor      Resp 05/27/13 1725 16      BP 05/27/13 1725 130/61 mmHg      BP Location 05/27/13 1725 Left arm      BP Method 05/27/13 1725 Automatic      Patient Position 05/27/13 1725 Sitting   SpO2 05/27/13 1725 100 %   O2 Device 05/27/13 1725 None (Room air)       Physical Exam   Vitals reviewed.  Constitutional: Vital signs are normal. She appears well-developed and well-nourished. She is cooperative.  Non-toxic appearance. No distress.   Patient appears uncomfortable but nontoxic   HENT:   Head: Normocephalic and  atraumatic.   Right Ear: External ear normal.   Left Ear: External ear normal.   Nose: Nose normal.   Mouth/Throat: Oropharynx is clear and moist.   Eyes: Conjunctivae and EOM are normal. Pupils are equal, round, and reactive to light.   Neck: Normal range of motion and full passive range of motion without pain. Neck supple.   Cardiovascular: Normal rate, regular rhythm, intact distal pulses and normal pulses.    Pulmonary/Chest: Effort normal and breath sounds normal. No respiratory distress.   Abdominal: Soft. Normal appearance and bowel sounds are normal. She exhibits no mass. There is tenderness. There is guarding. There is no rebound and no CVA tenderness. No hernia.   Positive left costovertebral angle tenderness and left upper flank tenderness   Musculoskeletal: Normal range of motion. She exhibits no edema.   Lymphadenopathy:     She has no cervical adenopathy.   Neurological: She is alert.  She is oriented to person, place, time and situation.  She is alert. Normal speech without aphasia or dysarthria.  Moves all extremities spontaneosly and symmetrically.  She has normal strength and normal reflexes. No cranial nerve deficit or sensory deficit. Gait is normal without ataxia.    Skin: Skin is warm, dry and intact. No rash noted. She is not diaphoretic.   Psychiatric: She has a normal mood and affect. Her speech is normal and behavior is normal. Thought content normal.         Diagnostic Studies     Labs:    Normal renal function.  Urinalysis shows blood but no infection.    Radiology:    Noncontrast CT abdomen pelvis to evaluate for kidney stones shows a 5 mm stone in the left kidney as well as a 3 mm stone in the left proximal ureter with mild to moderate hydronephrosis..    Emergency Department Procedures     Procedures    ED Course and MDM     Julia Keller is a 42 y.o. female who presented to the emergency department with Flank Pain   patient was given fluids, Zofran, Toradol, morphine and with this is  feeling much improved.  She is tolerating oral medications.  She was given Flomax and Percocet prior to discharge.  Patient is given the number for urology to followup as an outpatient, as was urine strainers.  She has calcium oxalate crystals in her urine however in that is discussed with the patient as well.  She'll be sent home with Percocet, Zofran, Flomax.  She is given a note for work.  As the stone is 3 mm, it will likely pass on its own.  Dr. Lowell Guitar has seen this patient agrees with the assessment and plan.    Assessment: Julia Keller, [place]  05/28/13 [phone]

## 2013-05-27 NOTE — Unmapped (Signed)
ED Attending Attestation Note    Date of service:  05/27/2013    This patient was seen by the mid-level provider.  I have seen and examined the patient, agree with the workup, evaluation, management and diagnosis.  The care plan has been discussed and I concur.      My assessment reveals a 42 y.o. female who presents to the emergency department today with left flank pain.  On physical examination the patient has no abdominal tenderness.Julia Keller

## 2013-05-27 NOTE — Unmapped (Signed)
Plan of care discussed with pt by Tx team. Pt given DCD instructions and paper work. Pt given opportunity to express any concerns/ questions. Pt verbalizes understanding denies any questions or concerns at this time. Pt encouraged to follow up w/ PCP and to return to ER for any concerns or emergent health care needs.

## 2013-05-27 NOTE — Unmapped (Signed)
Report from prior RN received. Nurse handoff and transfer of care completed. Meet and greet completed utilizing AIDET tool. Plan of care and assessment discussed with pt. Pt denies any questions at this time. Pt A&O x3 RR e/u skin p/w/d

## 2013-05-27 NOTE — Unmapped (Signed)
Pt. aox3 female c/o Lt. Flank pain while driving home from work. Sts. Pain has gotten worse now radiating to LLQ. Pt. NO N/V.   No hx of Kidney Stones, No Hx of UTI. No Burning urination.

## 2013-06-06 ENCOUNTER — Inpatient Hospital Stay: Admit: 2013-06-06 | Payer: PRIVATE HEALTH INSURANCE

## 2013-06-06 DIAGNOSIS — N200 Calculus of kidney: Secondary | ICD-10-CM

## 2015-09-08 ENCOUNTER — Inpatient Hospital Stay

## 2015-09-08 ENCOUNTER — Ambulatory Visit: Admit: 2015-09-08 | Payer: PRIVATE HEALTH INSURANCE

## 2015-09-08 ENCOUNTER — Ambulatory Visit: Admit: 2015-09-08 | Discharge: 2015-09-08 | Payer: PRIVATE HEALTH INSURANCE | Attending: Internal Medicine

## 2015-09-08 DIAGNOSIS — M75111 Incomplete rotator cuff tear or rupture of right shoulder, not specified as traumatic: Secondary | ICD-10-CM

## 2015-09-08 MED ORDER — MELOXICAM 15 MG PO TABS
15 MG | ORAL_TABLET | Freq: Every day | ORAL | 2 refills | Status: DC
Start: 2015-09-08 — End: 2015-10-13

## 2015-09-08 NOTE — Progress Notes (Signed)
Chief Complaint:   Chief Complaint   Patient presents with   ??? Shoulder Pain     NP R shoulder; pt states that she had an old injury to her right shoulder where her shoulder separated. She states that since then she has chronic pain in her shoulder at all times.          History of Present Illness:       Patient is a 45 y.o. female presents with the above complaint. The symptoms began 1 yearsago and started with an injury. The patient describes a sharp, aching pain that does radiate.  The symptoms are intermittent  and are are worsening since the onset.  She is right-hand dominant and works as a Marketing executive she is having increased pain and difficulty relative to the right shoulder with pain that can radiate into the lateral brachium.  In this typically callosity plantar aspect of the shoulder but sometimes posterior.  She rates the pain as 5-6/10 in severity she initially noted the onset of pain when maneuvering or lifting an object into the backseat of the car.  She apparently felt some mechanical symptoms related to that event and elected to treat this with activity modification and conservative measures.    She has sought chiropractic care and despite this his symptoms have persisted and gradually have worsened and becoming more problematic for her especially with closed chain activity and her symptoms follow typical rotator cuff provocative pattern.    She denies a correlation of pain with neck range of motion she denies any new onset progressive weakness of the upper extremity she was previously well prior to this.    She denies any neuritic quality symptoms.  She has no history of autoimmune disease inflammatory arthropathy or crystal arthropathy she denies any mechanical symptoms that are predictable.  She has a nocturnal component of pain which is positional         Past Medical History:      No past medical history on file.    No past surgical history on file.      Present Medications:          Current Outpatient Prescriptions   Medication Sig Dispense Refill   ??? Probiotic Product (PROBIOTIC DAILY PO) Take by mouth     ??? Cholecalciferol (VITAMIN D3) 5000 UNITS TABS Take by mouth     ??? ferrous sulfate (FER-IN-SOL) 75 (15 FE) MG/ML solution Take 15 mg by mouth daily     ??? magnesium 30 MG tablet Take 30 mg by mouth 2 times daily     ??? meloxicam (MOBIC) 15 MG tablet Take 1 tablet by mouth daily Take 1 daily for 10 days then daily when necessary thereafter 30 tablet 2     No current facility-administered medications for this visit.          Allergies:      No Known Allergies     Social History:         Social History     Social History   ??? Marital status: N/A     Spouse name: N/A   ??? Number of children: N/A   ??? Years of education: N/A     Occupational History   ??? Not on file.     Social History Main Topics   ??? Smoking status: Not on file   ??? Smokeless tobacco: Not on file   ??? Alcohol use Not on file   ??? Drug use: Not on  file   ??? Sexual activity: Not on file     Other Topics Concern   ??? Not on file     Social History Narrative        Review of Symptoms:    Pertinent items are noted in HPI    Review of systems reviewed from Patient History Form dated on today's date and   available in the patient's chart under the Media tab.      Vital Signs:    There were no vitals filed for this visit.     General Exam:     Constitutional: Patient is adequately groomed with no evidence of malnutrition  Mental Status: The patient is oriented to time, place and person.  The patient's mood and affect are appropriate.  Vascular: Examination reveals no swelling or calf tenderness.  Peripheral pulses are palpable and 2+.    Lymphatics: no lymphadenopathy of the cervical or axillary regions or upper extremity      Physical Exam: right shoulder      Primary Exam:    Inspection:  No deformity atrophy or effusion      Palpation:  Mild tenderness over the supraspinatus tendon, mild over the proximal biceps      Range of Motion:   Full range and symmetric positive for pain overhead and extremes      Strength:  Mildly diminished in external rotation and abduction with resisted pain      Special Tests:  Impingement sign positive, proximal biceps signs negative      Skin: There are no rashes, ulcerations or lesions.      Gait: Nonantalgic      Reflex intact upper     Additional Comments:        Additional Examinations:           Left Upper Extremity: Examination of the left upper extremity does not show any tenderness, deformity or injury.  Range of motion is unremarkable.  There is no gross instability.  There are no rashes, ulcerations or lesions.  Strength and tone are normal.  Neck: Examination of the neck does not show any tenderness, deformity or injury.  Range of motion is unremarkable.  There is no gross instability.  There are no rashes, ulcerations or lesions.  Strength and tone are normal.   Neurologic      Office Imaging Results/Procedures PerformedToday:      Radiology:      X-rays obtained and reviewed in office:   Views reviews   Location right shoulder   Impression glenohumeral joint has a normal radial graphic appearance there is a normal appearance of the acromioclavicular joint .  Type I acromion morphology no other soft tissue or osseous abnormalities        Logic E ultrasound  12 MHz    The patient was position seated on examination table.  The anterior soft tissues of the right shoulder were evaluated initially using linear transducer.  Image optimization was obtained.  The proximal biceps tendon was evaluated extensively and short axis and long axis and was noted to have normal sonographic echotexture without evidence of high-grade tendinopathy or discrete tear.  With dynamic imaging there was no evidence of instability of the proximal biceps tendon.    The subscapularis was then evaluated extensively and short axis and long axis.  The tendon was noted to have normal caliber/volume without evidence of high-grade  tendinopathy or discrete tear.  Power Doppler imaging negative    The patient was  then positioned in CRASS position and the supraspinatus and infraspinatus tendons were evaluated extensively and short axis and long axis.  The supraspinatus was noted to have increased caliber and homogenous decrease in echogenicity consistent with strain, moreover there was a discrete area at the footplate insertion mid fiber region in the anterior segment of the supraspinatus suggestive of CID lestion.  This was evident on both coronal oblique and short axis imaging.  The infraspinatus had a normal sonographic appearance without evidence of high-grade tendinopathy or discrete tear.  Power Doppler imaging negative    The subacromial bursa was physiologic.  Dynamic impingement asked reveal no evidence of soft tissue subacromial impingement.    Using virtual convex imaging, the posterior glenohumeral joint was visualized.  There was no evidence of intra-articular effusion.  The posterior labrum was identified but not well delineated on this particular examination.  There was no evidence of intra-articular effusion.    The acromial clavicular joint revealed low-grade degenerative change.    There was no evidence of soft tissue mass or cystic lesions.         Office Procedures:     Orders Placed This Encounter   Procedures   ??? Shoulder Right 3V     73030     Order Specific Question:   Reason for exam:     Answer:   Pain   ??? US Extremity Non Vascular Complete     Order Specific Question:   Reason for exam:     Answer:   right shoulder pain   ??? Ambulatory referral to Physical Therapy     Referral Priority:   Routine     Referral Type:   Eval and Treat     Referral Reason:   Specialty Services Required     Requested Specialty:   Physical Therapy     Number of Visits Requested:   1           Other Outside Imaging and Testing Personally Reviewed:       none          Assessment   Impression: .    Encounter Diagnoses   Name Primary?   ???  Incomplete tear of right rotator cuff Yes   ??? Acute pain of right shoulder    ??? 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 The options, risks, complications, alternative treatment as well as some of the differential diagnosis was discussed. The patient was thoroughly informed and all questions were answered. the patient indicated understanding and satisfaction with the discussion.      Orders:        Orders Placed This Encounter   Procedures   ??? Shoulder Right 3V     73030     Order Specific Question:   Reason for exam:     Answer:   Pain   ??? US Extremity Non Vascular Complete     Order Specific Question:   Reason for exam:  Answer:   right shoulder pain   ??? Ambulatory referral to Physical Therapy     Referral Priority:   Routine     Referral Type:   Eval and Treat     Referral Reason:   Specialty Services Required     Requested Specialty:   Physical Therapy     Number of Visits Requested:   1           Disclaimer:    "This note was dictated with voice recognition software. Though review and correction are routine, we apologize for any errors."

## 2015-09-17 ENCOUNTER — Encounter: Attending: Rehabilitative and Restorative Service Providers"

## 2015-10-06 ENCOUNTER — Encounter: Attending: Internal Medicine

## 2015-10-13 ENCOUNTER — Ambulatory Visit: Admit: 2015-10-13 | Discharge: 2015-10-13 | Payer: PRIVATE HEALTH INSURANCE | Attending: Internal Medicine

## 2015-10-13 DIAGNOSIS — M75111 Incomplete rotator cuff tear or rupture of right shoulder, not specified as traumatic: Secondary | ICD-10-CM

## 2015-10-13 MED ORDER — DICLOFENAC SODIUM 50 MG PO TBEC
50 MG | ORAL_TABLET | Freq: Two times a day (BID) | ORAL | 1 refills | Status: DC | PRN
Start: 2015-10-13 — End: 2016-11-22

## 2015-10-13 NOTE — Progress Notes (Signed)
Chief Complaint:   Chief Complaint   Patient presents with   ??? Shoulder Pain     f/u R shoulder pain; pt states that despite going to PT shoulder feels the same.          History of Present Illness:       Patient is a 45 y.o. female returns follow up for the above complaint. The patient was last seen approximately 1 monthsago. The symptoms show no change since the last visit. The patient has had no further testing for the problem.  In the interim she has started formal course of physical therapy which has included dry needling treatments.  Although this was not directly ordered by me or standard part of rehabilitative regimen related to the working diagnosis, I do not think this is cause for any harm or worsening of her symptoms.    However she is noted new-onset symptoms of popping about the shoulder and mechanical symptoms have been more active than previously described.  One of the more significant events occurred position herself out of the shoulder bridge.    She denies any new onset of progressive weakness of the upper extremity she denies correlation of pain with neck range of motion she is localizes pain about the anterolateral aspect of the shoulder with pain that can radiate into the brachium distally without radiation of pain below the elbow     Past Medical History:      No past medical history on file.     Present Medications:         Current Outpatient Prescriptions   Medication Sig Dispense Refill   ??? Probiotic Product (PROBIOTIC DAILY PO) Take by mouth     ??? Cholecalciferol (VITAMIN D3) 5000 UNITS TABS Take by mouth     ??? ferrous sulfate (FER-IN-SOL) 75 (15 FE) MG/ML solution Take 15 mg by mouth daily     ??? magnesium 30 MG tablet Take 30 mg by mouth 2 times daily     ??? meloxicam (MOBIC) 15 MG tablet Take 1 tablet by mouth daily Take 1 daily for 10 days then daily when necessary thereafter 30 tablet 2     No current facility-administered medications for this visit.          Allergies:      No Known  Allergies        Review of Systems:    Pertinent items are noted in HPI         Vital Signs:    There were no vitals filed for this visit.     General Exam:     Constitutional: Patient is adequately groomed with no evidence of malnutrition    Physical Exam: right shoulder      Primary Exam:    Inspection:  No deformity atrophy or effusion      Palpation:  No focal tenderness      Range of Motion:  Full range and symmetric mild pain overhead and in extremes for elevation      Strength:  Mildly diminished in abduction with resisted pain      Special Tests:  O'Brien's test equivocal, dynamic external rotation shear test positive for pain      Skin: There are no rashes, ulcerations or lesions.      Gait: Nonantalgic      Neurovascular - non focal and intact       Additional Comments:        Additional Examinations:  Cervical spine-full range of motion without pain      Office Imaging Results/Procedures PerformedToday:             Office Procedures:   No orders of the defined types were placed in this encounter.          Other Outside Imaging and Testing Personally Reviewed:       none          Assessment   Impression: .    Encounter Diagnoses   Name Primary?   ??? Incomplete tear of right rotator cuff Yes   ??? Rotator cuff impingement syndrome, left    ??? Chronic right shoulder pain               Plan:        MRI evaluation shoulder rule out labral tear/SLAP lesion  Continue formal course of physical therapy as per previous rotator cuff conditioning/scapular stabilization myofascial techniques when necessary  Discontinue Mobic trial of Voltaren short-term with GI precaution  Consider her a candidate for type I PRP injection CID lesion supraspinatus           Orders:      No orders of the defined types were placed in this encounter.        Valarie ConesEdward A Ailee Pates, MD.      Disclaimer:    "This note was dictated with voice recognition software. Though review and correction are routine, we apologize for any errors."

## 2015-10-14 NOTE — Telephone Encounter (Signed)
-----   Message from Ewing SchleinMarilyn Stevens sent at 10/14/2015  2:25 PM EDT -----  MRI right shoulder w/o contrast is approved.  Auth #  [id number], DATES 4/27 THRU 11/28/15.   MS

## 2015-10-14 NOTE — Telephone Encounter (Signed)
Spoke with pt and let her know that her MRI has been approved and instructed her to call [facility] to make an appt.

## 2015-10-21 ENCOUNTER — Ambulatory Visit: Admit: 2015-10-21 | Discharge: 2015-10-21 | Payer: PRIVATE HEALTH INSURANCE | Attending: Internal Medicine

## 2015-10-21 DIAGNOSIS — M75111 Incomplete rotator cuff tear or rupture of right shoulder, not specified as traumatic: Secondary | ICD-10-CM

## 2015-10-26 NOTE — Telephone Encounter (Signed)
Discussed with her in detail the findings of the CID lesion seen on Ultrasound and confirmed after re-read by Dr. Leta SpellerPomeranz , [facility].    Treatment options including TI PRP reviewed with her. She will call to schedule at her convenience.  We will make arrangements for her to speak with one of our patients with similar treatment.

## 2015-11-01 DIAGNOSIS — M75111 Incomplete rotator cuff tear or rupture of right shoulder, not specified as traumatic: Secondary | ICD-10-CM

## 2015-11-01 NOTE — Progress Notes (Signed)
Chief Complaint:   Chief Complaint   Patient presents with   ??? Shoulder Pain     f/u R shoulder pain; f/u MRI results; pt states that shoulder feels the same          History of Present Illness:       Patient is a 45 y.o. female returns follow up for the above complaint. The patient was last seen approximately 1 weeksago. The symptoms are improving since the last visit. The patient has had no further testing for the problem.  Shoulder pain has diminished with discontinuation of DNT as recommended.    However, baseline shoulder pain persists. MRI completed in the interim.         Past Medical History:      No past medical history on file.     Present Medications:         Current Outpatient Prescriptions   Medication Sig Dispense Refill   ??? diclofenac (VOLTAREN) 50 MG EC tablet Take 1 tablet by mouth 2 times daily as needed for Pain 60 tablet 1   ??? Probiotic Product (PROBIOTIC DAILY PO) Take by mouth     ??? Cholecalciferol (VITAMIN D3) 5000 UNITS TABS Take by mouth     ??? ferrous sulfate (FER-IN-SOL) 75 (15 FE) MG/ML solution Take 15 mg by mouth daily     ??? magnesium 30 MG tablet Take 30 mg by mouth 2 times daily       No current facility-administered medications for this visit.          Allergies:        Allergies   Allergen Reactions   ??? Mobic [Meloxicam] Other (See Comments)     Dizziness           Review of Systems:    Pertinent items are noted in HPI       Vital Signs:    There were no vitals filed for this visit.     General Exam:     Constitutional: Patient is adequately groomed with no evidence of malnutrition    Physical Exam: right shoulder      Primary Exam:    Inspection:  No deformity or atrophy      Palpation:  Non tender      Range of Motion:  FROM pos overhead and extremes    Strength:  Mild weakeness w/ pain abduciton      Special Tests:  neg      Skin: There are no rashes, ulcerations or lesions.      Neurologic -Light touch sensation and manual muscle testing is normal C5-C8 . Biceps and triceps  reflexes are symmetric and +2. Spurlling sign is negative      Additional Comments:        Additional Examinations:                  Office Imaging Results/Procedures PerformedToday:       MRI reviewed by myself and corroborated with the report  [facility]   null   [place], [place] [address]   ??   ??   Patient Name: Katherine Petersen   Case ID: [id number]   Patient DOB: [id number]   Referring Physician: Valarie Cones, MD   Exam Date: 10/19/2015   Exam Description: MR Right Shoulder joint w/o Contrast    ??   ??   HISTORY: 45 year old female evaluate rotator cuff tear.    ??   TECHNICAL FACTORS: Long- and short-axis  fat- and water-weighted images were performed.    ??   FINDINGS: No acute bony macrofracture or contusion. Mild high riding of the humeral head. No    acute labral tear. No paralabral cysts.    ??   Mild tendinopathy of supraspinatus and infraspinatus, no tear. Teres minor tendon intact. Mild    tendinopathy of subscapularis, no tear. Biceps long head tendon intact and in normal position.    ??   Mild hypertrophic AC arthropathy with capsulitis. Acromion type 2.    ??   Thin layer of fluid in the subacromial bursa. No acute muscle strain. No muscle atrophy.    ??   No substantive glenohumeral joint effusion. No adhesive capsulitis.    ??   CONCLUSION:    1. Mild tendinopathy of supraspinatus, infraspinatus and subscapularis, no tear.    2. Mild subacromion bursitis.    3. Mild capsulitis of AC joint.    ??   Thank you for the opportunity to provide your interpretation.   ??   Ming He, M.D.   ??   MH/tv   [facility] 10/19/2015 7:47 PM   T: TV 10/19/2015 11:36 PM             Office Procedures:   No orders of the defined types were placed in this encounter.          Other Outside Imaging and Testing Personally Reviewed:       none          Assessment   Impression: .    Encounter Diagnoses   Name Primary?   ??? Incomplete tear of right rotator cuff Yes   ??? Chronic right shoulder pain       CID Lesion supraspinatus  tendon        Plan:       Consider Ultrasound guided  IROM TI PRP injection Right shoulder  Activity modification - RTC prototocoal and RTC conditioning transition to IHEP  Re-read MRI - Pomeranz to corroborate US findings    I don not agree with the MRI findings that uderestimate the pathology evident on ultrasound relative to CID of the supraspinatus tendon.      Greater than half the time related to patient's visit was spent counseling the patient with respect to alternatives of treatment and options for treatment and complications and risks related to those treatment options and expectations related to recovery and outcomes relative to those treatment options     Orders:      No orders of the defined types were placed in this encounter.        Valarie ConesEdward A Helder Crisafulli, MD.      Disclaimer:    "This note was dictated with voice recognition software. Though review and correction are routine, we apologize for any errors."

## 2015-11-12 NOTE — Progress Notes (Signed)
Results for the following test have been finalized and entered into the patients chart

## 2016-07-25 MED ORDER — oseltamivir (TAMIFLU) 75 MG capsule
75 | ORAL_CAPSULE | Freq: Two times a day (BID) | ORAL | 0 refills | Status: AC
Start: 2016-07-25 — End: 2016-07-30

## 2016-07-25 NOTE — Telephone Encounter (Signed)
If her husband had the flu most likely she's also getting it now  i'll e-rx tamiflu: there was no preferred pharmacy so I just sent it to [facility] on [address]  Also of course: rest, fluids, tylenol or motrin prn

## 2016-07-25 NOTE — Telephone Encounter (Signed)
Called patient, advised of doctors advice. Patient wants to know if the tamiflu will even help since her symptoms started more than 2 days ago, fever stated yesterday.

## 2016-07-25 NOTE — Unmapped (Signed)
Called patient, advised of Dr. Doristine Church response. Patient voiced understanding. BO

## 2016-07-25 NOTE — Telephone Encounter (Signed)
If the fever started yesterday then I think we are still in the window where it may help  Go ahead and start it today

## 2016-07-25 NOTE — Telephone Encounter (Signed)
Pt calling stating her husband just got over having the flu and now she is starting to feel sick. She states she has a low grade fever, headache, congestion and runny nose. No sore throat or nausea. She is wanting to know if she should come in for an appointment or if something can be prescribed?

## 2016-11-22 ENCOUNTER — Ambulatory Visit: Admit: 2016-11-22 | Discharge: 2016-11-22 | Payer: PRIVATE HEALTH INSURANCE | Attending: Orthopaedic Surgery

## 2016-11-22 ENCOUNTER — Ambulatory Visit: Admit: 2016-11-22 | Discharge: 2016-11-22 | Payer: PRIVATE HEALTH INSURANCE

## 2016-11-22 DIAGNOSIS — M25562 Pain in left knee: Secondary | ICD-10-CM

## 2016-11-23 NOTE — Progress Notes (Signed)
Chief Complaint    Knee Pain (left knee)      History of Present Illness:  Katherine Petersen is a 46 y.o. female.  She is here for evaluation of her left knee.  She's had left knee problems that is been intermittent over last several years.  She states occasionally she will get an episode of locking of her knee where she is in deep flexion and she cannot extend her knee.  This has happened more frequently and she had an episode recently where it did lock in the knee pain has been significantly worse ever since then.  She feels like it locks over on the medial side.  She does work as a Marketing executive and she is now having difficulty with her activity because of this pain and the locking within the knee.  She has also noticed an effusion within the knee.           Medical History:  Patient's medications, allergies, past medical, surgical, social and family histories were reviewed and updated as appropriate.    Review of Systems:  Pertinent items are noted in HPI  Review of systems reviewed from Patient History Form dated on 11/23/16 and available in the patient's chart under the Media tab.       Vital Signs:  BP 126/71   Ht 5\' 5"  (1.651 m)   Wt 145 lb (65.8 kg)   BMI 24.13 kg/m     General Exam:   Constitutional: Patient is adequately groomed with no evidence of malnutrition  DTRs: Deep tendon reflexes are intact  Mental Status: The patient is oriented to time, place and person.  The patient's mood and affect are appropriate.    Knee Examination:    Inspection:  No significant swelling erythema noted about the left knee today    Palpation:  Have significant tenderness to palpation along the medial joint line.  No significant lateral joint line tenderness.  There is crepitus over the patellofemoral joint range of motion    Range of Motion:  Stent shin of the knee is 0, knee flexion today is to 125    Strength:  She is able to do straight leg raise    Special Tests:  Negative Lachman exam.  No instability to varus  and valgus stress testing.  Negative posterior drawer.  She does have a positive Apley exam and a positive McMurray with reproduction of pain along the medial joint line    Skin: There are no rashes, ulcerations or lesions.    Gait: She is walking with a normal gait      Additional Comments:       Additional Examinations:         Right Lower Extremity: Examination of the right lower extremity does not show any tenderness, deformity or injury.  Range of motion is unremarkable.  There is no gross instability.  There are no rashes, ulcerations or lesions.  Strength and tone are normal.    Radiology:     X-rays obtained and reviewed in office:  Views 4 views left knee demonstrates no evidence of fracture or dislocation or other osseous abnormalities         Assessment :  Left knee concern for medial meniscus tear    Impression:  Encounter Diagnoses   Name Primary?   . Left knee pain, unspecified chronicity Yes   . Acute medial meniscus tear of left knee, initial encounter        Office Procedures:  Orders  Placed This Encounter   Procedures   . XR KNEE LEFT (MIN 4 VIEWS)   . MRI KNEE LEFT WO CONTRAST     MRI left knee R/O MMT S83.249A    Will schedule at [facility] once approved       Treatment Plan:  I discussed the diagnosis and prognosis with her today.  Recommending at this time getting an MRI of the left knee to rule out medial meniscus tear.  She is agreeable with that plan.  She can modify activities to avoid any deep flexion or squatting to avoid further displacing the meniscus.  I will see her back in clinic following the MRI

## 2016-11-27 ENCOUNTER — Ambulatory Visit: Admit: 2016-11-27 | Discharge: 2016-11-27 | Payer: PRIVATE HEALTH INSURANCE | Attending: Orthopaedic Surgery

## 2016-11-27 ENCOUNTER — Ambulatory Visit: Admit: 2016-11-27 | Discharge: 2016-11-27 | Payer: PRIVATE HEALTH INSURANCE

## 2016-11-27 DIAGNOSIS — M25561 Pain in right knee: Secondary | ICD-10-CM

## 2016-11-27 NOTE — Progress Notes (Signed)
Chief Complaint    Knee Pain (right knee)      History of Present Illness:  Katherine Petersen is a 46 y.o. female. She is here for evaluation of her right knee.  This past Friday she was getting worked on it chiropractor went to roll off the table.  A pop within her right knee.  She had immediate pain and swelling within the right knee.  She now has been having difficulty with weightbearing and feels something is caught within the knee.  Pain is primarily over the medial side of the knee.  She denies radicular pain or numbness or tingling.  Denies any prior history of injury to this right knee.           Medical History:  Patient's medications, allergies, past medical, surgical, social and family histories were reviewed and updated as appropriate.    Review of Systems:  Pertinent items are noted in HPI  Review of systems reviewed from Patient History Form dated on 11/27/16 and available in the patient's chart under the Media tab.       Vital Signs:  Ht 5\' 5"  (1.651 m)   Wt 145 lb 1 oz (65.8 kg)   BMI 24.14 kg/m     General Exam:   Constitutional: Patient is adequately groomed with no evidence of malnutrition  DTRs: Deep tendon reflexes are intact  Mental Status: The patient is oriented to time, place and person.  The patient's mood and affect are appropriate.  Lymphatic: The lymphatic examination bilaterally reveals all areas to be without enlargement or induration.    Knee Examination:    Inspection:  No significant swelling erythema noted about the right knee today    Palpation:  There is a mild palpable effusion within the right knee.  There is palpable click along the lateral joint line when taken the knee through range of motion with a palpable thickened plica.      Range of Motion:  0-130 with pain at full extension    Strength:  She is able to do a straight leg raise    Special Tests:  Negative Lachman exam.  No instability to varus and valgus stress testing.  She does have both a positive Apley and McMurray  and there is a palpable click with McMurray along the posterior medial joint line    Skin: There are no rashes, ulcerations or lesions.    Gait: she is walking with an antalgic gait      Additional Comments:       Additional Examinations:         Thoracic Spine: Examination of the thoracic spine does not show any tenderness, deformity or injury.  Range of motion is unremarkable.  There is no gross instability.  There are no rashes, ulcerations or lesions.  Strength and tone are normal.  Neck: Examination of the neck does not show any tenderness, deformity or injury.  Range of motion is unremarkable.  There is no gross instability.  There are no rashes, ulcerations or lesions.  Strength and tone are normal.  Lower Back: Examination of the lower back does not show any tenderness, deformity or injury.  Range of motion is unremarkable.  There is no gross instability.  There are no rashes, ulcerations or lesions.  Strength and tone are normal.    Radiology:     X-rays obtained and reviewed in office:  Views 4 views of the right knee demonstrates no evidence fracture dislocation or other osseous abnormalities  Assessment :  Right knee concern for medial meniscus tear    Impression:  Encounter Diagnoses   Name Primary?   . Right knee pain, unspecified chronicity Yes   . Acute medial meniscus tear of right knee, initial encounter        Office Procedures:  Orders Placed This Encounter   Procedures   . XR KNEE RIGHT (MIN 4 VIEWS)       Treatment Plan:  I discussed diagnosis and prognosis with her today.  She has significant swelling within the knee pain along the medial joint line with clinical exam consistent with medial meniscus tear.  Recommending at this time MRI to evaluate the medial meniscus.  She'll continue to modify activity until we do get that done.  I'll see her back once that MRI is completed

## 2016-11-27 NOTE — Addendum Note (Signed)
Addended by: Lilian KapurWESTRICH, Arriah Wadle A. on: 11/27/2016 10:07 AM     Modules accepted: Orders

## 2016-11-29 ENCOUNTER — Ambulatory Visit: Admit: 2016-11-29 | Discharge: 2016-11-29 | Payer: PRIVATE HEALTH INSURANCE | Attending: Orthopaedic Surgery

## 2016-11-29 DIAGNOSIS — M2240 Chondromalacia patellae, unspecified knee: Secondary | ICD-10-CM

## 2016-11-29 NOTE — Progress Notes (Signed)
Chief Complaint    Results (MRI results left knee)      History of Present Illness:  Katherine Petersen is a 46 y.o. female.  She is here for follow-up for her bilateral knees.  She did have an MRI completed on the left knee and is here today for the results.           Medical History:  Patient's medications, allergies, past medical, surgical, social and family histories were reviewed and updated as appropriate.    Review of Systems:  Pertinent items are noted in HPI  Review of systems reviewed from Patient History Form dated on 11/29/16 and available in the patient's chart under the Media tab.       Vital Signs:  Ht 5\' 5"  (1.651 m)   Wt 145 lb 1 oz (65.8 kg)   BMI 24.14 kg/m     General Exam:   Constitutional: Patient is adequately groomed with no evidence of malnutrition  DTRs: Deep tendon reflexes are intact  Mental Status: The patient is oriented to time, place and person.  The patient's mood and affect are appropriate.    Knee Examination:      Inspection:  No significant swelling erythema noted about the left knee today    Palpation:  Have significant tenderness to palpation along the medial joint line.  No significant lateral joint line tenderness.  There is crepitus over the patellofemoral joint range of motion    Range of Motion:  Stent shin of the knee is 0, knee flexion today is to 125    Strength:  She is able to do straight leg raise    Special Tests:  Negative Lachman exam.  No instability to varus and valgus stress testing.  Negative posterior drawer.  She does have a positive Apley exam and a positive McMurray with reproduction of pain along the medial joint line    Skin: There are no rashes, ulcerations or lesions.    Gait: She is walking with a normal gait      Additional Comments:       Additional Examinations:         Lower Back: Examination of the lower back does not show any tenderness, deformity or injury.  Range of motion is unremarkable.  There is no gross instability.  There are no  rashes, ulcerations or lesions.  Strength and tone are normal.    Radiology:     The MRI of the left knee does demonstrate evidence of patellofemoral arthropathy with intermediate to high-grade chondromalacia at the patellar apex and full-thickness cartilage ulceration.  There are some subchondral pseudocysts with mild marrow reaction.  She does demonstrate some generalized swelling within the knee.  No evidence of loose body.  No evidence of meniscal tear    Assessment :  Left knee patellofemoral chondromalacia    Impression:  Encounter Diagnosis   Name Primary?   . Chondromalacia of patellofemoral joint, unspecified laterality Yes       Office Procedures:  Orders Placed This Encounter   Procedures   . PR METHYLPREDNISOLONE 40 MG INJ   . PR ARTHROCENTESIS ASPIR&/INJ MAJOR JT/BURSA W/O [place]       Treatment Plan:  I discussed diagnosis and prognosis with her today.  We discussed treatment options.  She is having very similar symptoms in the right knee as well and I would recommend this time trial cortisone injection into the bilateral knees.  She is agreeable with that plan.  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  She is going to continue her her own therapy program.  I'm going to see her back if this is not improved over the next several weeks.  We do have an MRI scheduled for her right knee on Monday she does not get improvement with cortisone injection she'll continue with that MRI.    Under sterile conditions bilateral knees were injected through superior lateral approach with 2 mL of 40 mg Depo-Medrol mixed with 3 mL quarter percent Marcaine.  She did tolerate the injection well.  Postinjection precautions were given

## 2016-11-29 NOTE — Progress Notes (Signed)
11/29/16 3:38 PM         NDC: [id number]    Lot Number: [id number]    Comments: bilat knees

## 2016-12-08 NOTE — Progress Notes (Signed)
Results for the following test have been finalized and entered into the patients chart

## 2016-12-11 ENCOUNTER — Encounter: Attending: Orthopaedic Surgery

## 2017-01-03 NOTE — Progress Notes (Signed)
Results for the following test have been finalized and entered into the patients chart

## 2017-07-24 ENCOUNTER — Encounter: Payer: Self-pay | Admitting: Obstetrics and Gynecology

## 2017-07-24 ENCOUNTER — Ambulatory Visit (INDEPENDENT_AMBULATORY_CARE_PROVIDER_SITE_OTHER): Payer: 59 | Admitting: Obstetrics and Gynecology

## 2017-07-24 DIAGNOSIS — Z3202 Encounter for pregnancy test, result negative: Secondary | ICD-10-CM | POA: Diagnosis not present

## 2017-07-24 DIAGNOSIS — N938 Other specified abnormal uterine and vaginal bleeding: Secondary | ICD-10-CM

## 2017-07-24 LAB — POCT URINE PREGNANCY: Preg Test, Ur: NEGATIVE

## 2017-07-24 NOTE — Patient Instructions (Signed)

## 2017-07-24 NOTE — Progress Notes (Signed)
Mrs Kristine Hughes presents with c/o Vaginal bleeding since Jun 24 2017.  Bleeding has been heavy to lite spotting. She denies any pain, cramps or PMS/Menopausal Sx. LMP 03/03/17 nl 3 days. Regular cycles q28 day prior. Sexual active without problems. Husband vasectomy.  Last pap 9/18 normal, no STD. Declines mammograms TSVD x 2 SAB x 1 No smoker [place] here [place]t husband's job with [employer]  PE AF VSS Lungs clear Heart RRR Abd soft + BS GU Nl EGBUS cervix no lesions, spotting noted, uterus small < 8  Weeks, mobile, non tender, no masses  A/P AUB  AUB reviewed with pt. Will check labs and GYN U/S. Suspect hormonal related. Will start Loloestrin. U/R/B reviewed. F/U in 3 months or sooner pending test results.

## 2017-07-25 LAB — CBC
HEMOGLOBIN: 12.9 g/dL (ref 11.1–15.9)
Hematocrit: 39.3 % (ref 34.0–46.6)
MCH: 30.4 pg (ref 26.6–33.0)
MCHC: 32.8 g/dL (ref 31.5–35.7)
MCV: 93 fL (ref 79–97)
Platelets: 276 10*3/uL (ref 150–379)
RBC: 4.25 x10E6/uL (ref 3.77–5.28)
RDW: 13.8 % (ref 12.3–15.4)
WBC: 5.4 10*3/uL (ref 3.4–10.8)

## 2017-07-25 LAB — TSH: TSH: 1.2 u[IU]/mL (ref 0.450–4.500)

## 2017-07-31 ENCOUNTER — Ambulatory Visit
Admission: RE | Admit: 2017-07-31 | Discharge: 2017-07-31 | Disposition: A | Discharge status: Home / Self Care | Payer: 59 | Source: Ambulatory Visit | Attending: Obstetrics and Gynecology | Admitting: Obstetrics and Gynecology

## 2017-07-31 DIAGNOSIS — N938 Other specified abnormal uterine and vaginal bleeding: Secondary | ICD-10-CM

## 2017-08-06 ENCOUNTER — Telehealth: Payer: Self-pay

## 2017-08-06 NOTE — Telephone Encounter (Signed)
Pt called wanting to know results on her US that was done. Please review

## 2017-08-08 ENCOUNTER — Encounter: Payer: Self-pay | Admitting: Obstetrics and Gynecology

## 2017-08-08 ENCOUNTER — Ambulatory Visit (INDEPENDENT_AMBULATORY_CARE_PROVIDER_SITE_OTHER): Payer: 59 | Admitting: Obstetrics and Gynecology

## 2017-08-08 DIAGNOSIS — N83299 Other ovarian cyst, unspecified side: Secondary | ICD-10-CM | POA: Diagnosis not present

## 2017-08-08 NOTE — Patient Instructions (Signed)
Ovarian Cyst An ovarian cyst is a fluid-filled sac that forms on an ovary. The ovaries are small organs that produce eggs in women. Various types of cysts can form on the ovaries. Some may cause symptoms and require treatment. Most ovarian cysts go away on their own, are not cancerous (are benign), and do not cause problems. Common types of ovarian cysts include:  Functional (follicle) cysts. ? Occur during the menstrual cycle, and usually go away with the next menstrual cycle if you do not get pregnant. ? Usually cause no symptoms.  Endometriomas. ? Are cysts that form from the tissue that lines the uterus (endometrium). ? Are sometimes called "chocolate cysts" because they become filled with blood that turns brown. ? Can cause pain in the lower abdomen during intercourse and during your period.  Cystadenoma cysts. ? Develop from cells on the outside surface of the ovary. ? Can get very large and cause lower abdomen pain and pain with intercourse. ? Can cause severe pain if they twist or break open (rupture).  Dermoid cysts. ? Are sometimes found in both ovaries. ? May contain different kinds of body tissue, such as skin, teeth, hair, or cartilage. ? Usually do not cause symptoms unless they get very big.  Theca lutein cysts. ? Occur when too much of a certain hormone (human chorionic gonadotropin) is produced and overstimulates the ovaries to produce an egg. ? Are most common after having procedures used to assist with the conception of a baby (in vitro fertilization).  What are the causes? Ovarian cysts may be caused by:  Ovarian hyperstimulation syndrome. This is a condition that can develop from taking fertility medicines. It causes multiple large ovarian cysts to form.  Polycystic ovarian syndrome (PCOS). This is a common hormonal disorder that can cause ovarian cysts, as well as problems with your period or fertility.  What increases the risk? The following factors may make  you more likely to develop ovarian cysts:  Being overweight or obese.  Taking fertility medicines.  Taking certain forms of hormonal birth control.  Smoking.  What are the signs or symptoms? Many ovarian cysts do not cause symptoms. If symptoms are present, they may include:  Pelvic pain or pressure.  Pain in the lower abdomen.  Pain during sex.  Abdominal swelling.  Abnormal menstrual periods.  Increasing pain with menstrual periods.  How is this diagnosed? These cysts are commonly found during a routine pelvic exam. You may have tests to find out more about the cyst, such as:  Ultrasound.  X-ray of the pelvis.  CT scan.  MRI.  Blood tests.  How is this treated? Many ovarian cysts go away on their own without treatment. Your health care provider may want to check your cyst regularly for 2-3 months to see if it changes. If you are in menopause, it is especially important to have your cyst monitored closely because menopausal women have a higher rate of ovarian cancer. When treatment is needed, it may include:  Medicines to help relieve pain.  A procedure to drain the cyst (aspiration).  Surgery to remove the whole cyst.  Hormone treatment or birth control pills. These methods are sometimes used to help dissolve a cyst.  Follow these instructions at home:  Take over-the-counter and prescription medicines only as told by your health care provider.  Do not drive or use heavy machinery while taking prescription pain medicine.  Get regular pelvic exams and Pap tests as often as told by your health care   provider.  Return to your normal activities as told by your health care provider. Ask your health care provider what activities are safe for you.  Do not use any products that contain nicotine or tobacco, such as cigarettes and e-cigarettes. If you need help quitting, ask your health care provider.  Keep all follow-up visits as told by your health care provider.  This is important. Contact a health care provider if:  Your periods are late, irregular, or painful, or they stop.  You have pelvic pain that does not go away.  You have pressure on your bladder or trouble emptying your bladder completely.  You have pain during sex.  You have any of the following in your abdomen: ? A feeling of fullness. ? Pressure. ? Discomfort. ? Pain that does not go away. ? Swelling.  You feel generally ill.  You become constipated.  You lose your appetite.  You develop severe acne.  You start to have more body hair and facial hair.  You are gaining weight or losing weight without changing your exercise and eating habits.  You think you may be pregnant. Get help right away if:  You have abdominal pain that is severe or gets worse.  You cannot eat or drink without vomiting.  You suddenly develop a fever.  Your menstrual period is much heavier than usual. This information is not intended to replace advice given to you by your health care provider. Make sure you discuss any questions you have with your health care provider. Document Released: 06/05/2005 Document Revised: 12/24/2015 Document Reviewed: 11/07/2015 Elsevier Interactive Patient Education  2018 [employer] Inc.  

## 2017-08-08 NOTE — Progress Notes (Signed)
Mrs Kristine Hughes presents for follow of U/S. U/S revealed complex left ovarian cyst. Reviewed with pt.  Will check CA 125  Repeat U/S in 6 weeks Bleeding has stopped with OCP's, will continue F/U as per lab and U/S results.

## 2017-08-09 LAB — CA 125: CANCER ANTIGEN (CA) 125: 10.4 U/mL (ref 0.0–38.1)

## 2017-08-16 ENCOUNTER — Encounter: Payer: Self-pay | Admitting: Obstetrics and Gynecology

## 2017-09-11 ENCOUNTER — Ambulatory Visit
Admission: RE | Admit: 2017-09-11 | Discharge: 2017-09-11 | Disposition: A | Discharge status: Home / Self Care | Payer: 59 | Source: Ambulatory Visit | Attending: Obstetrics and Gynecology | Admitting: Obstetrics and Gynecology

## 2017-09-11 DIAGNOSIS — N83299 Other ovarian cyst, unspecified side: Secondary | ICD-10-CM

## 2017-09-12 ENCOUNTER — Telehealth: Payer: Self-pay

## 2017-09-12 DIAGNOSIS — N83202 Unspecified ovarian cyst, left side: Secondary | ICD-10-CM

## 2017-09-12 MED ORDER — NORETHIN-ETH ESTRAD-FE BIPHAS 1 MG-10 MCG / 10 MCG PO TABS: 1.0000 | ORAL_TABLET | Freq: Every day | ORAL | 11 refills | Status: AC

## 2017-09-12 NOTE — Telephone Encounter (Signed)
Advised of rx sent by provider. 

## 2017-09-12 NOTE — Telephone Encounter (Signed)
S/w pt and advised of u/s results and need for follow up u/s. Pt stated that provider gave her samples of lo lo estrogen BC pills and wants to know if he will send rx. Advised pt that I would send the provider a message and follow up.

## 2017-10-03 ENCOUNTER — Telehealth: Payer: Self-pay

## 2017-10-03 NOTE — Telephone Encounter (Signed)
Received call from pt states Loloestrin is $170. I contacted the pharmacy to see if there is a lower out of pocket BCP for patient. The pharmacist said Orthrotricyclen Lo is the lowest at $85; however, the patient does not want to pay that either. Patient said honestly even though the sample pack of Lo Loestrin eased the AUB, she does not want to take pills everyday. She is also refusing to attend the planned follow up ultrasound scheduled for next month because of the out of pocket expense and her deductible is $1600.    Patient aware I will consult with provider regarding her concerns.

## 2017-10-04 NOTE — Telephone Encounter (Signed)
After speaking with Dr. Alysia PennaErvin, the medical recommendation is to have the follow up U/S to document resolving/resolved complex ovarian cyst. However it is her choice.   In regards to the OCP's she can discontinue after current samples are gone. She can follow cycle afterwards but if abnormal bleeding returns, she should begin BCP's.

## 2017-11-13 ENCOUNTER — Ambulatory Visit
Admission: RE | Admit: 2017-11-13 | Discharge: 2017-11-13 | Disposition: A | Discharge status: Home / Self Care | Payer: 59 | Source: Ambulatory Visit | Attending: Obstetrics and Gynecology | Admitting: Obstetrics and Gynecology

## 2017-11-13 DIAGNOSIS — N83202 Unspecified ovarian cyst, left side: Secondary | ICD-10-CM

## 2019-02-07 IMAGING — US US PELVIS COMPLETE TRANSABD/TRANSVAG
1 series · 13 of 25 positions shown · non-contrast
Comparison: No prior.

CLINICAL DATA: Dysfunctional uterine bleeding.

EXAM:
TRANSABDOMINAL AND TRANSVAGINAL ULTRASOUND OF PELVIS
TECHNIQUE: Both transabdominal and transvaginal ultrasound examinations of the
pelvis were performed. Transabdominal technique was performed for
global imaging of the pelvis including uterus, ovaries, adnexal
regions, and pelvic cul-de-sac. It was necessary to proceed with
endovaginal exam following the transabdominal exam to visualize the
uterus and ovaries.

[Series 1: us pelvis complete transabd/transvag · 0.26mm/px · 13 of 50 slices shown]
[im 1/50]
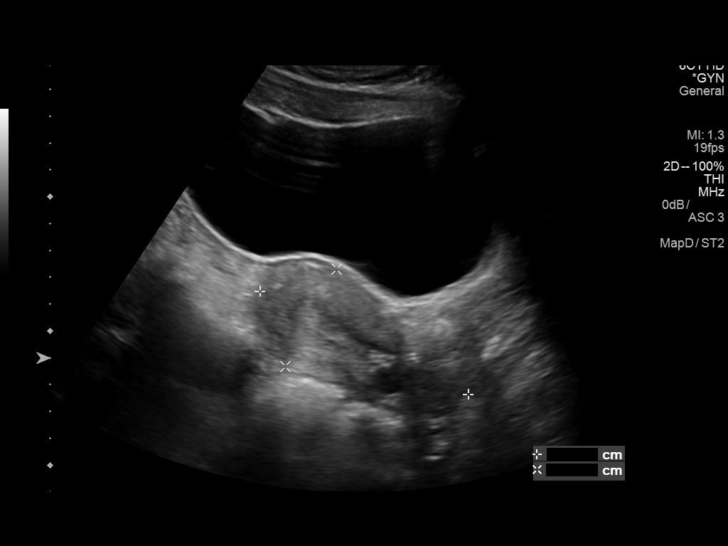
[im 5/50]
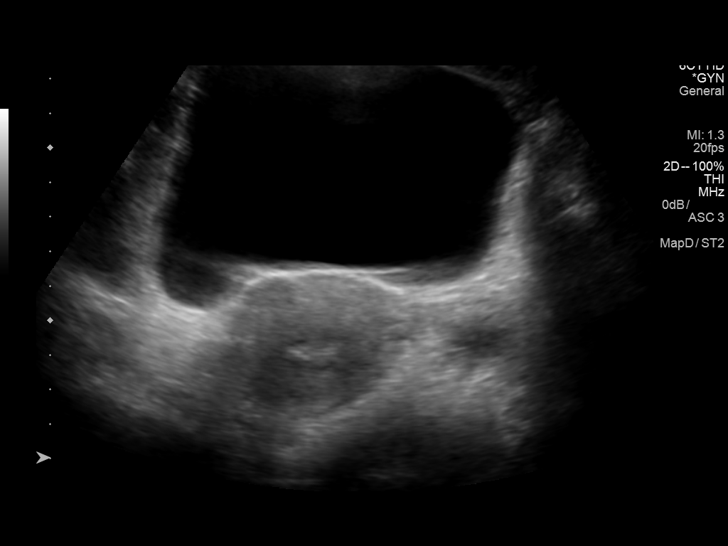
[im 9/50]
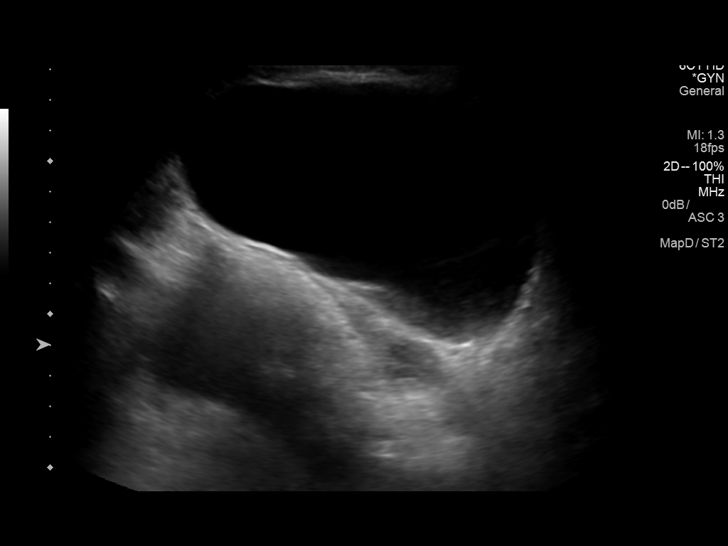
[im 13/50]
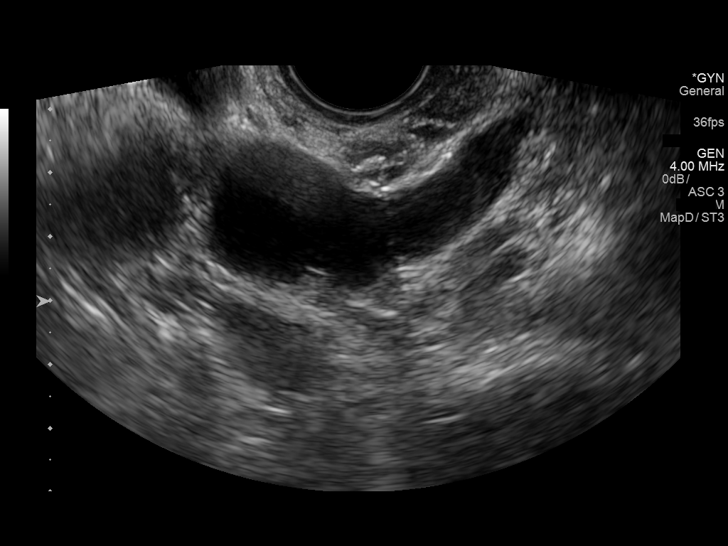
[im 17/50]
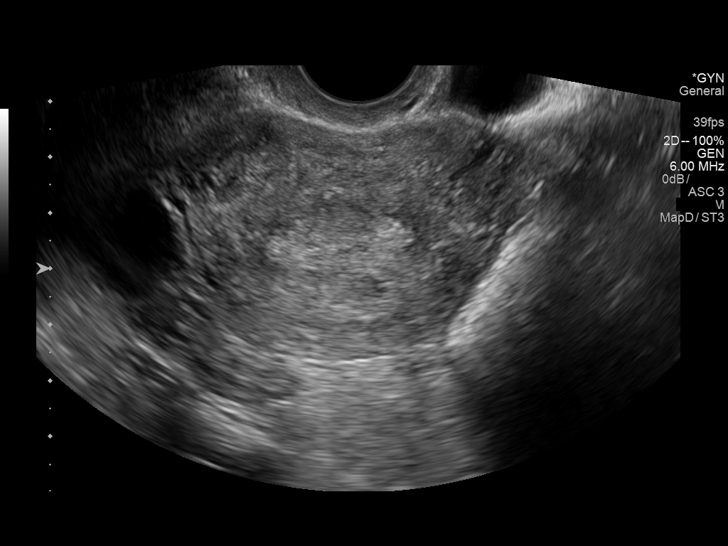
[im 21/50]
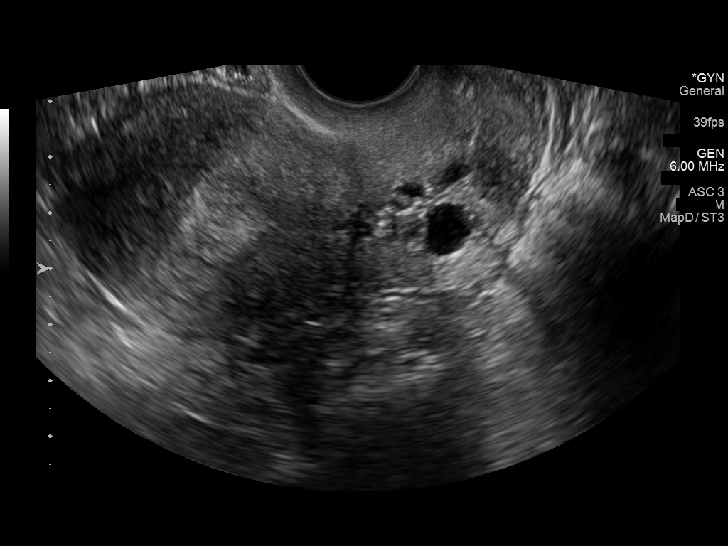
[im 25/50]
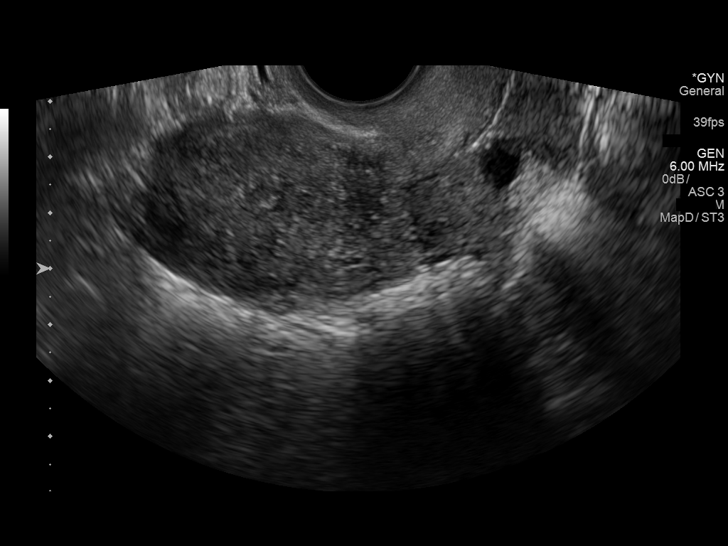
[im 29/50]
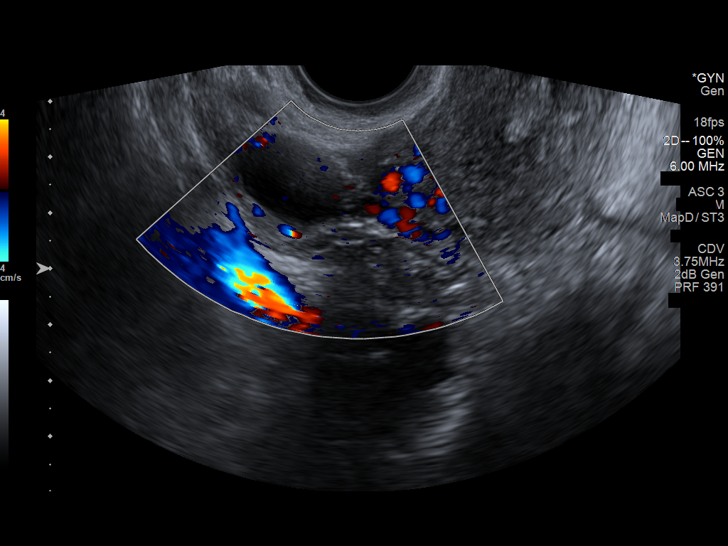
[im 33/50]
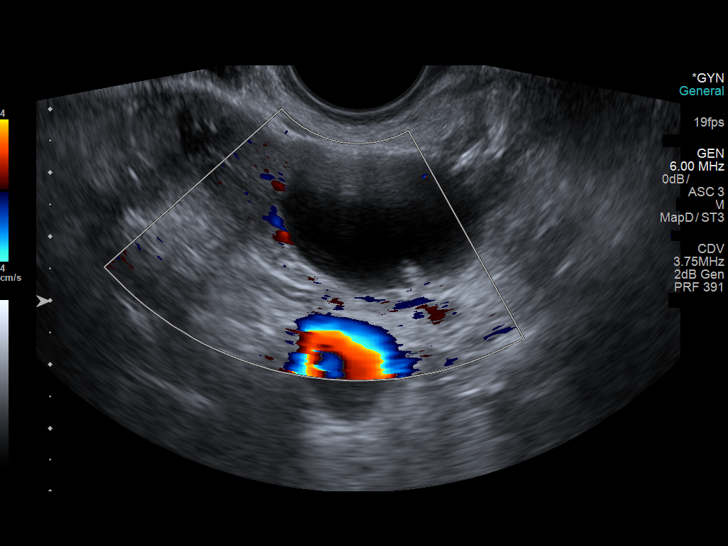
[im 37/50]
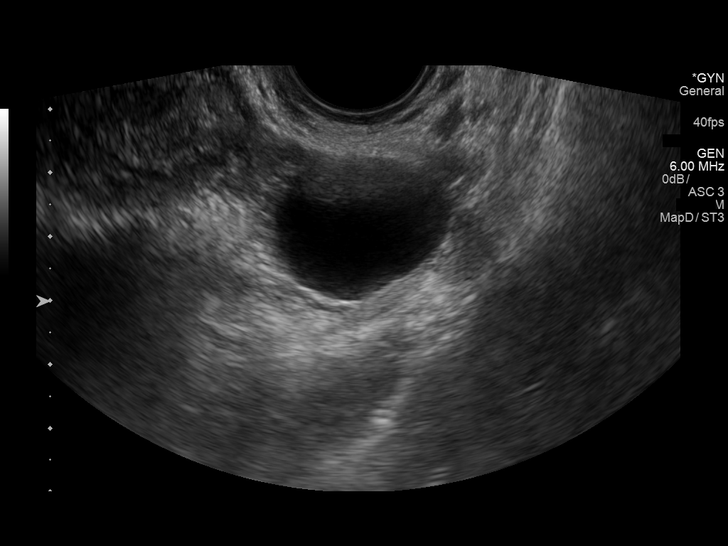
[im 41/50]
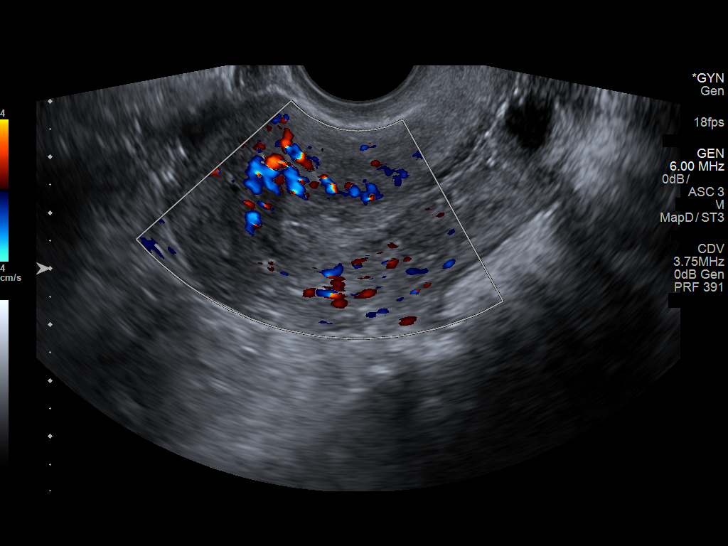
[im 45/50]
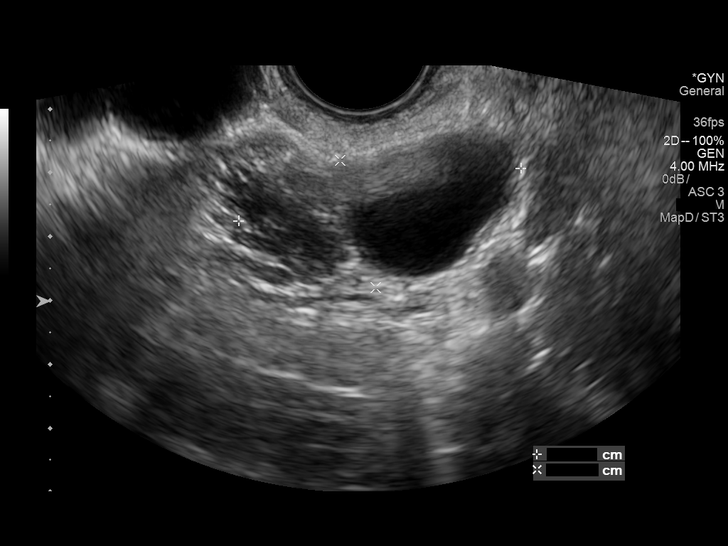
[im 50/50]
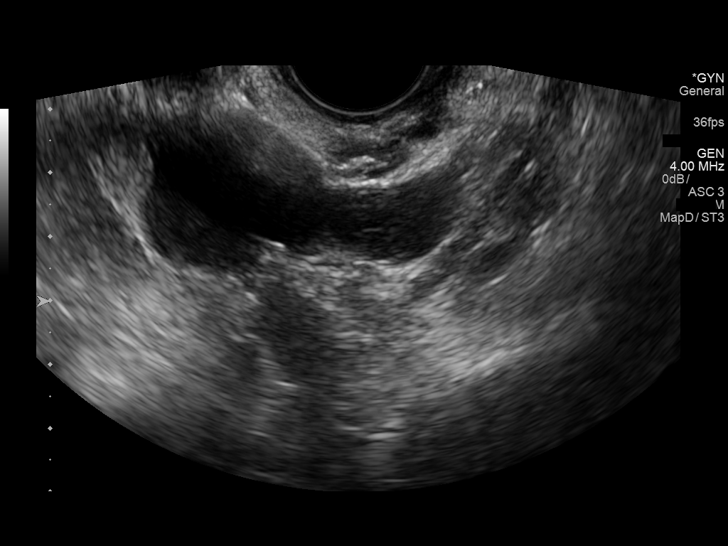

[13 of 25 positions shown; findings below may reference images not displayed]

FINDINGS: Uterus

Measurements: 8.3 x 4.0 x 5.0 cm no fibroids noted. Cystic changes
noted over the upper cervix, these may represent nabothian cyst.

Endometrium

Thickness: 9.4 mm.  No focal abnormality visualized.

Right ovary

Measurements: 2.8 x 2.2 x 2.5 cm.  2.3 x 1.5 x 2.5 cm simple cyst.

Left ovary

There is a 4.5 x 2.2 x 2.9 cm complex cystic mass in the region of
the left ovary. Some degree of nodularity is noted. It is difficult
to discern if this complex mass involves the ovary or adjacent
fallopian tube and/or adnexa.

No abnormal free fluid.
IMPRESSION: 1.4.5 x 2.2 x 2.9 cm complex cystic mass with nodularity noted the
region of the left ovary. It is difficult to discern if this
involves the ovary or adjacent fallopian tube and/or adnexa.
Pregnancy test to exclude ectopic pregnancy suggested. Malignancy
cannot be excluded. Gynecologic evaluation suggested.

2.  2.3 x 1.5 x 2.5 simple cyst right ovary.

3. Prominent cystic structure in the upper portion of the cervix.
This could represent a large nabothian cyst.

## 2019-03-21 IMAGING — US US PELVIS COMPLETE TRANSABD/TRANSVAG
1 series · 13 of 25 positions shown · non-contrast
Comparison: 07/31/2017.

CLINICAL DATA: Follow-up left ovarian cyst.

EXAM:
TRANSABDOMINAL AND TRANSVAGINAL ULTRASOUND OF PELVIS
TECHNIQUE: Both transabdominal and transvaginal ultrasound examinations of the
pelvis were performed. Transabdominal technique was performed for
global imaging of the pelvis including uterus, ovaries, adnexal
regions, and pelvic cul-de-sac. It was necessary to proceed with
endovaginal exam following the transabdominal exam to visualize the
uterus and ovaries.

[Series 1: us pelvis complete transabd/transvag · 0.28mm/px · 38 acquisitions, 13 frames shown]
[im 1/38]
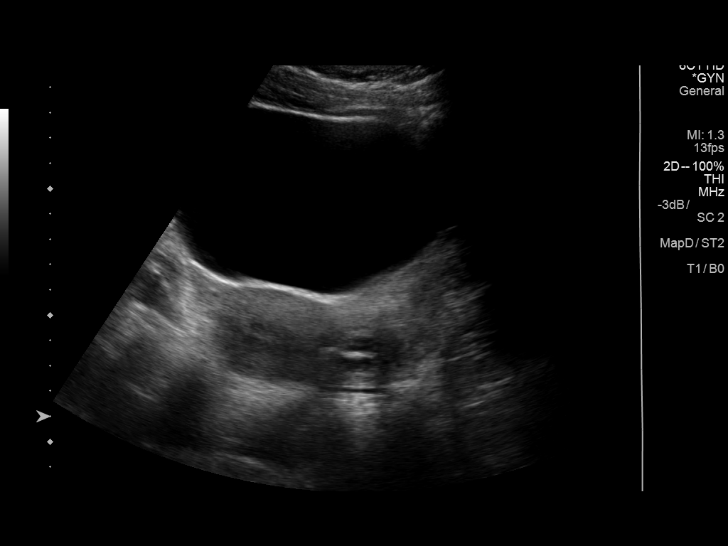
[im 4/38]
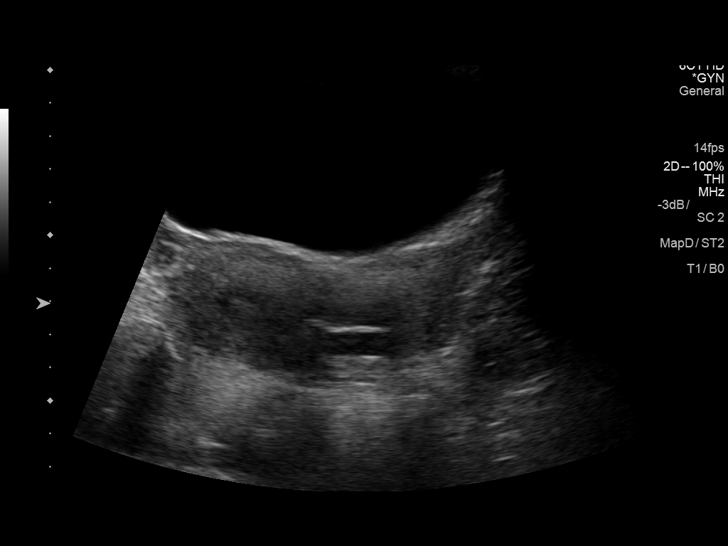
[im 7/38]
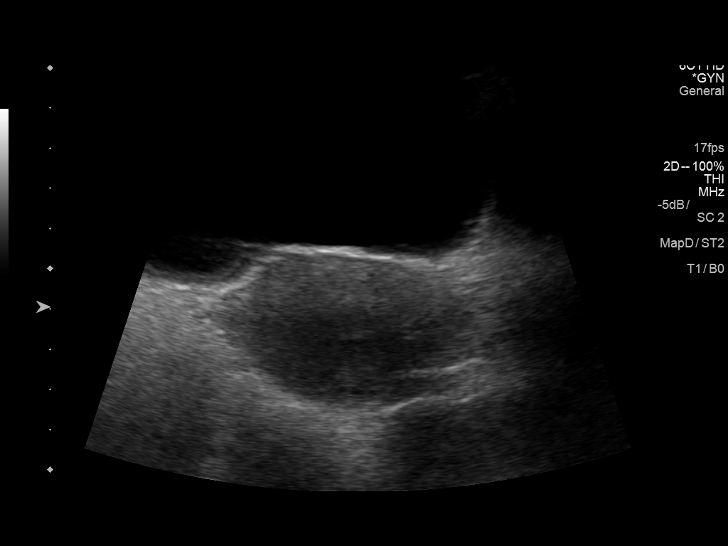
[im 10/38]
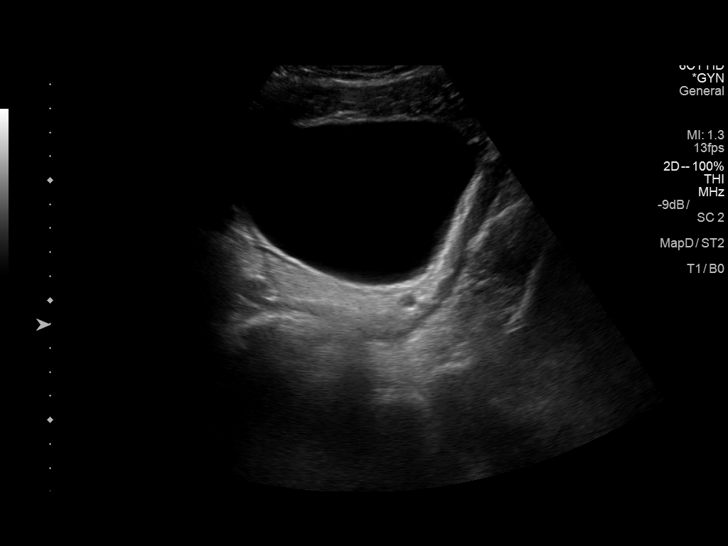
[im 13/38]
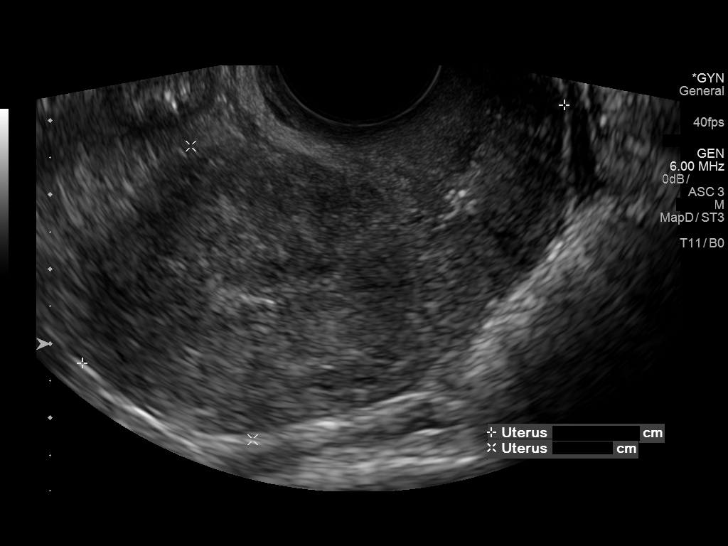
[im 16/38]
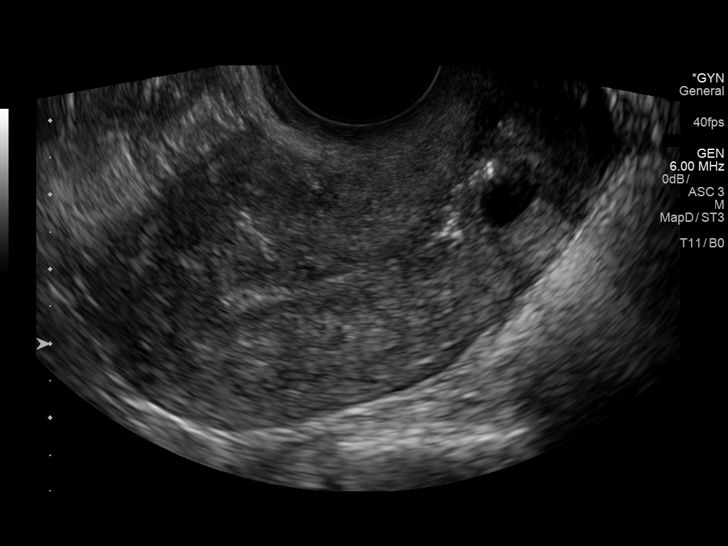
[im 19/38]
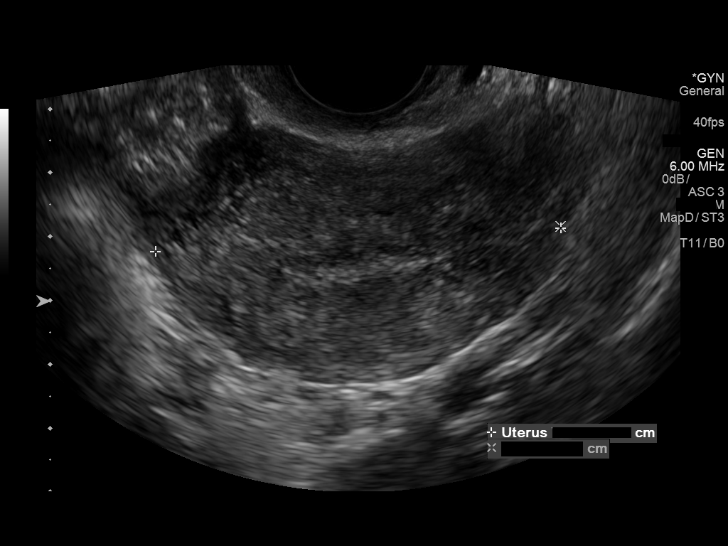
[im 22/38]
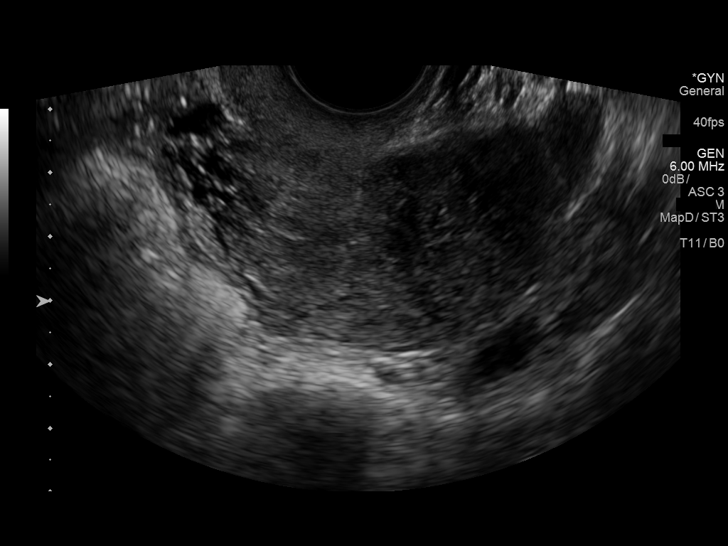
[im 25/38]
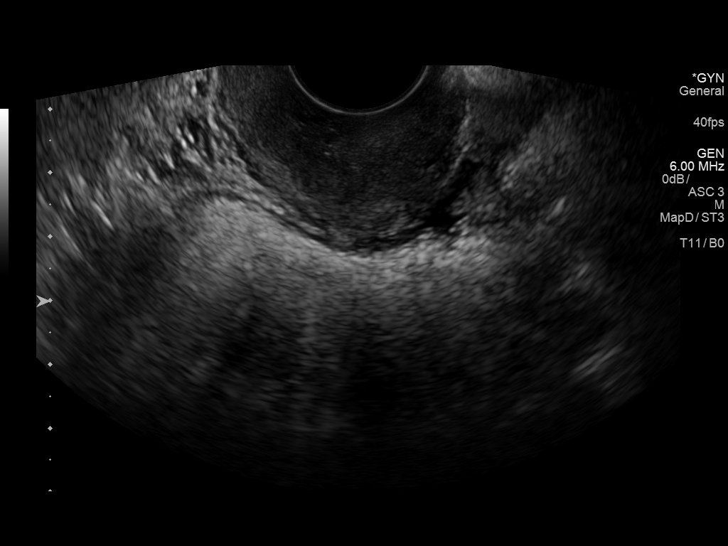
[im 28/38]
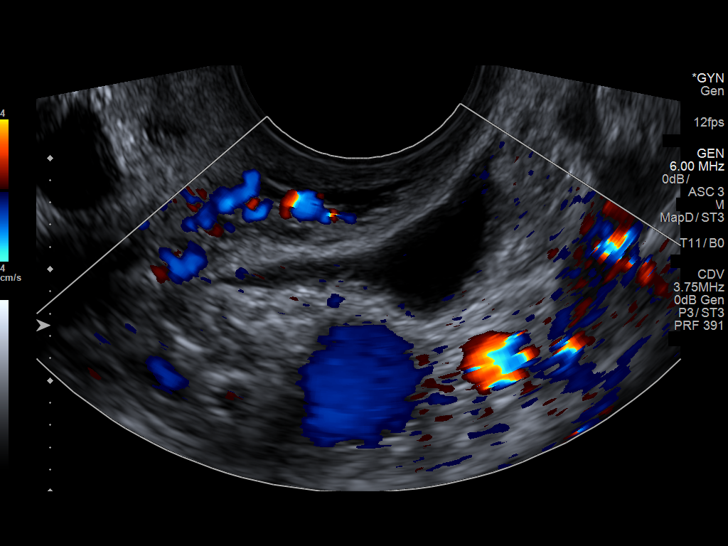
[im 31/38]
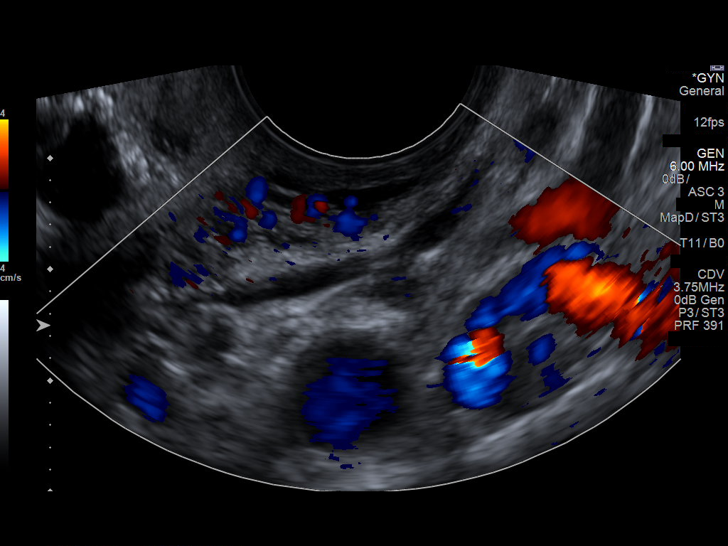
[im 34/38]
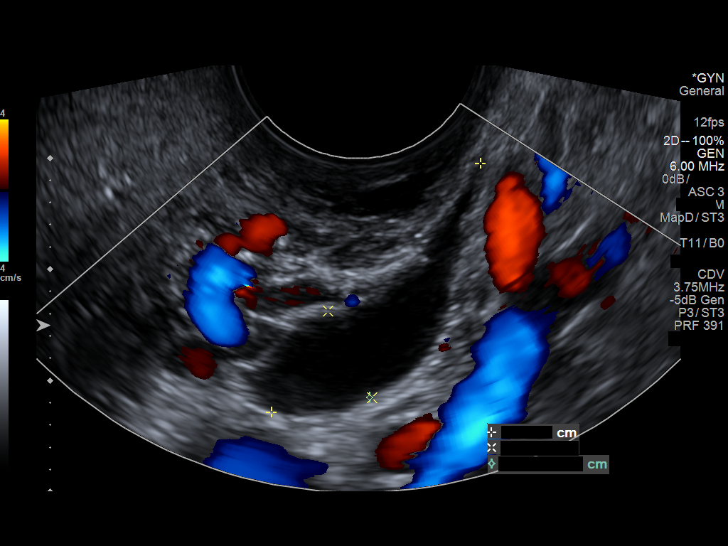
[im 38/38]
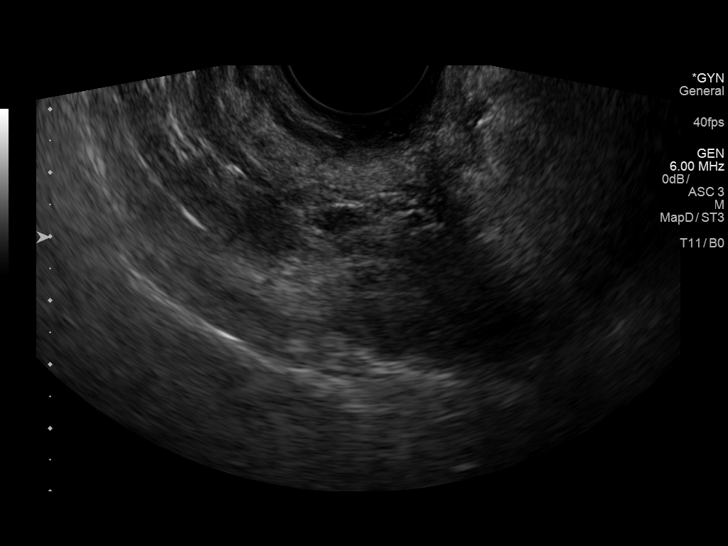

[13 of 25 positions shown; findings below may reference images not displayed]

FINDINGS: Uterus

Measurements: 8.7 x 3.6 x 5.9 cm. No fibroids or other mass
visualized. Prominent cysts again noted in the cervix consistent
with nabothian cysts. Similar findings on prior exam.

Endometrium

Thickness: 3.5 mm.  No focal abnormality visualized.

Right ovary

Not visualized.

Left ovary

Not visualized. Again noted is a cystic structure in the left
adnexa. On today's exam it measures 2.7 x 0.8 x 2.9 cm and appears
less prominent than on prior exam. No internal echoes noted on
today's exam. This cystic structure could represent resolving free
pelvic fluid. Left fallopian tube dilatation and left ovarian cyst
could also present this fashion. Again the finding is less prominent
than on prior exam. Continued follow-up exams to demonstrate
resolution can be obtained.

Other findings

No free fluid noted in the cul-de-sac.
IMPRESSION: Previously identified cystic structure in the left adnexa is less
prominent on today's exam measuring 2.7 x 0.8 x 2.9 cm. No internal
echoes noted on today's exam. This cystic structure could represent
resolving free pelvic fluid. Left fallopian tube dilatation and left
ovarian cyst could also present in this fashion. Again finding is
less prominent than on prior exam. Continued follow-up exams 2
demonstrate resolution can be obtained.

## 2019-05-23 IMAGING — US US PELVIS COMPLETE TRANSABD/TRANSVAG
1 series · 13 of 25 positions shown · non-contrast
Comparison: Prior ultrasounds from 09/11/2017 and 07/31/2017

CLINICAL DATA: Follow-up examination for re-evaluation of left
adnexal cystic lesion.



[Series 1: us pelvis complete transabd/transvag · 0.28mm/px · 41 acquisitions, 13 frames shown]
[im 1/41]
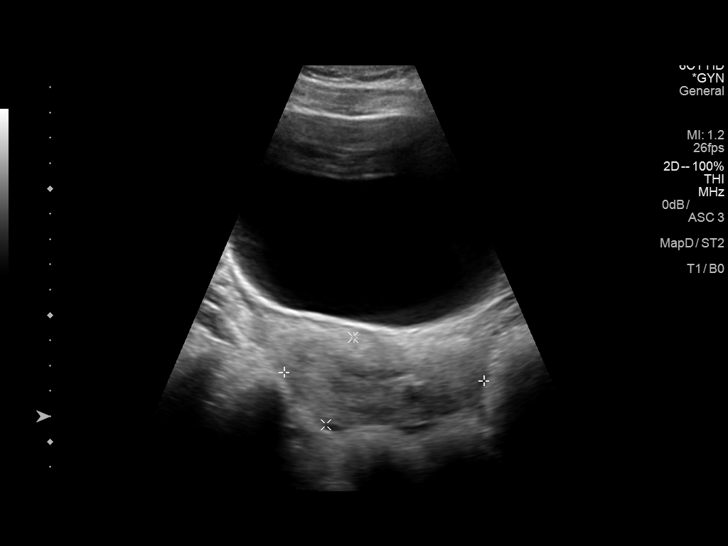
[im 4/41]
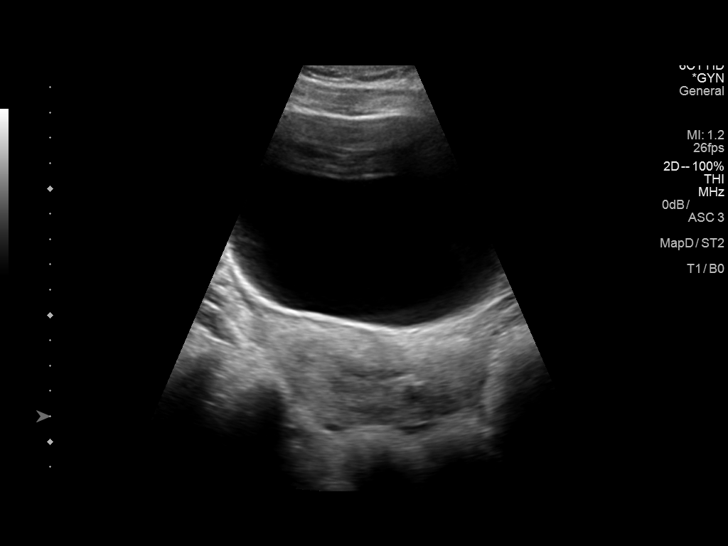
[im 7/41]
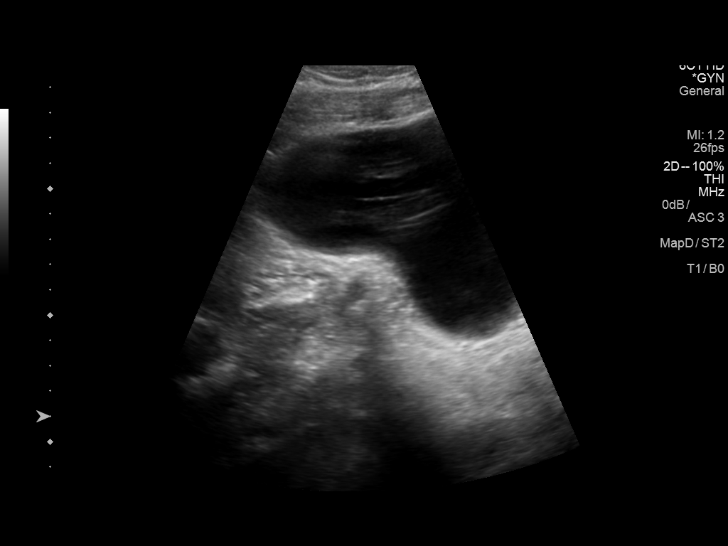
[im 11/41]
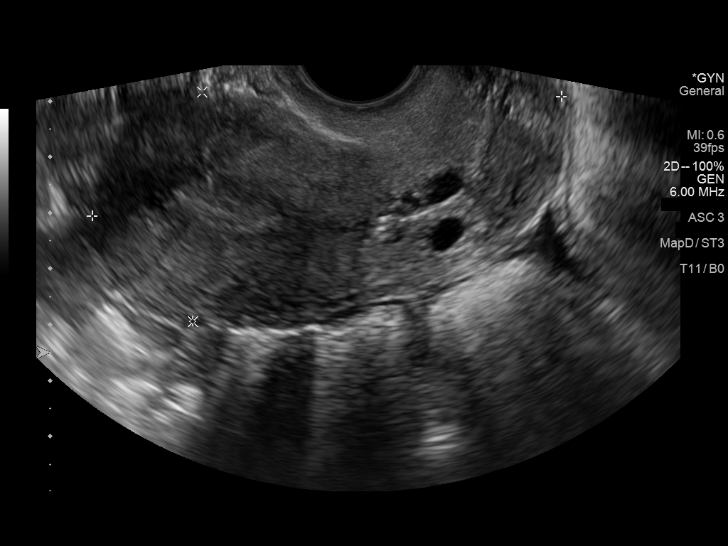
[im 14/41]
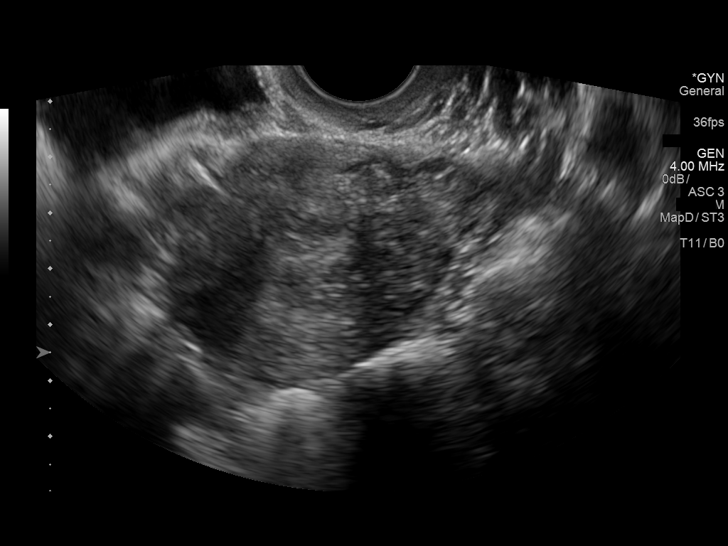
[im 17/41]
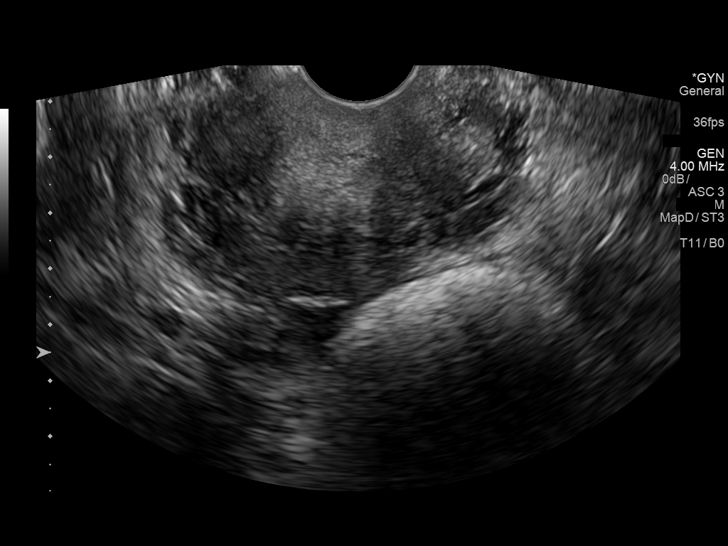
[im 21/41]
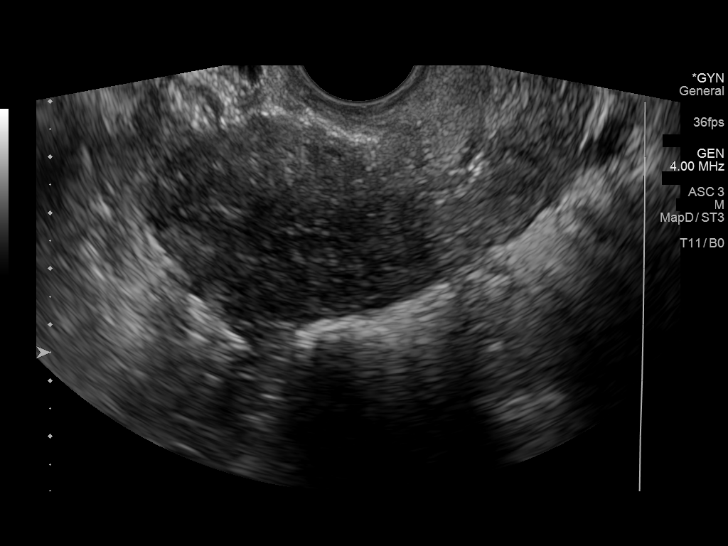
[im 24/41]
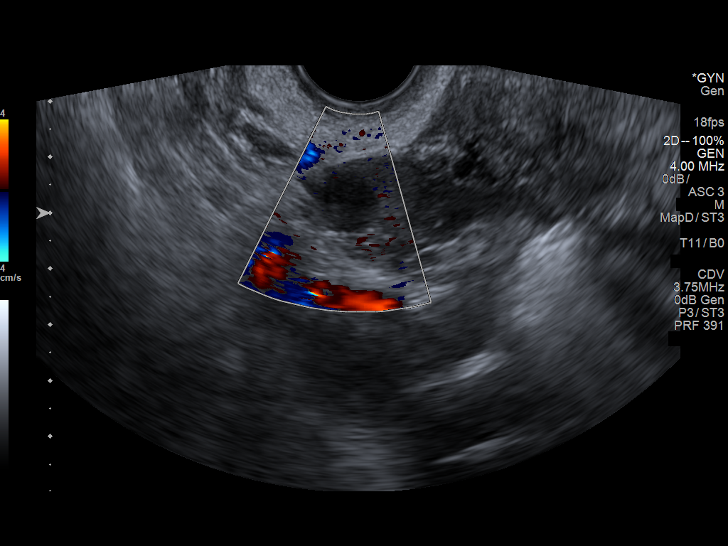
[im 27/41]
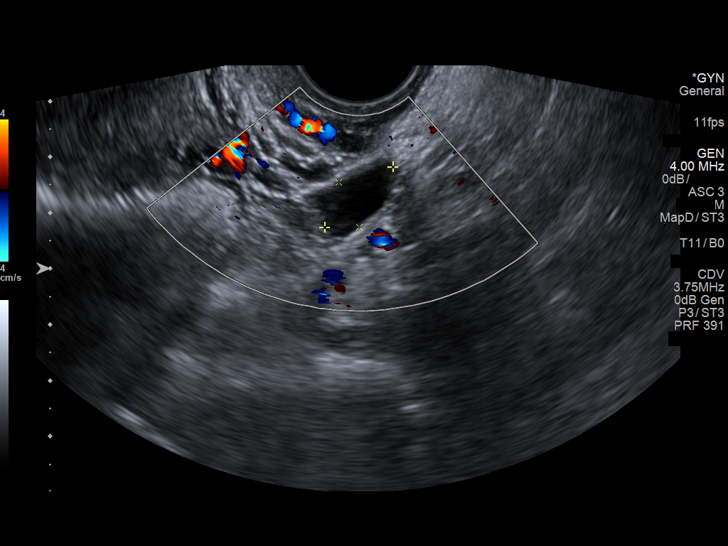
[im 31/41]
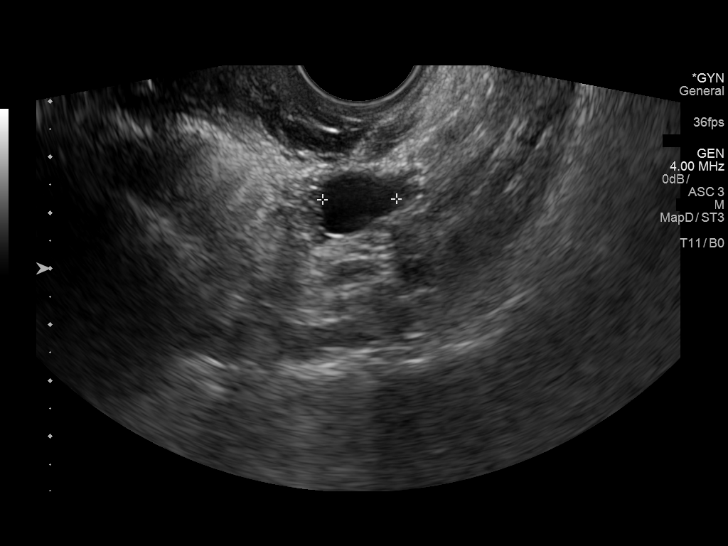
[im 34/41]
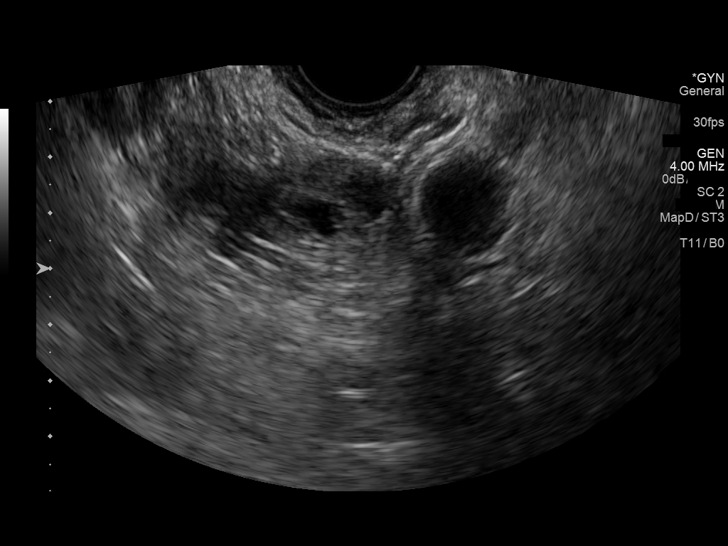
[im 37/41]
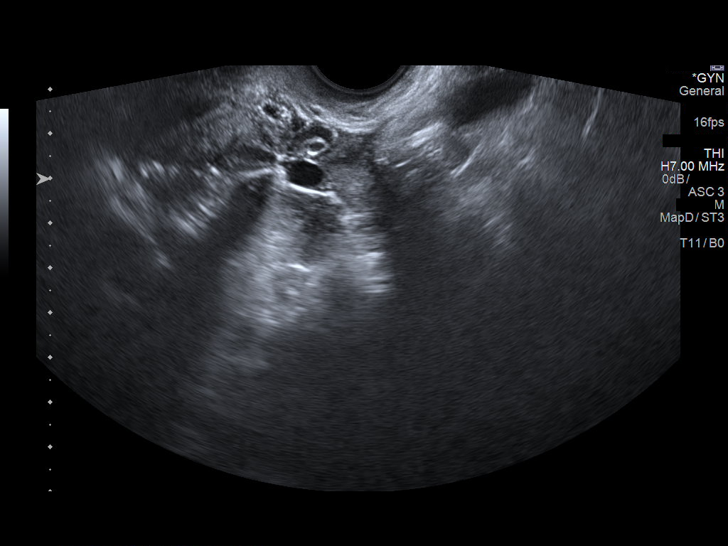
[im 41/41]
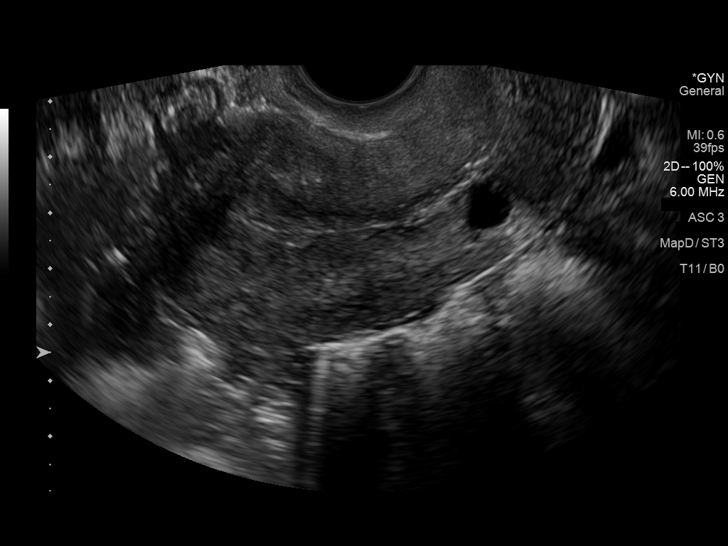

[13 of 25 positions shown; findings below may reference images not displayed]

FINDINGS: Uterus

Measurements: 8.7 x 4.1 x 5.9 cm. No fibroids or other mass
visualized. Few prominent nabothian cysts again noted at the cervix,
stable.

Endometrium

Thickness: 5.7 mm.  No focal abnormality visualized.

Right ovary

Measurements: 1.8 x 1.4 x 1.3 cm. Normal appearance/no adnexal mass.

Left ovary

Measurements: 2.5 x 1.6 x 2.2 cm. Previously identified cystic
structure within the left adnexa again seen, less prominent and
decreased in size on today's exam measuring 1.6 x 0.9 x 1.3 cm,
previously 2.7 x 0.8 x 2.9 cm on 09/11/2017. Structure is
predominantly simple in appearance without significant internal
complexity. No internal vascularity or solid nodularity.

Other findings

No abnormal free fluid.
IMPRESSION: 1. Continued interval decrease in size and prominence of left
adnexal cystic structure, measuring 1.6 x 0.9 x 1.3 cm on today's
exam, previously 2.7 x 0.8 x 2.9 cm on 09/11/2017. Differential
considerations as previously described. Again, continued follow-up
could be performed to document complete resolution as clinically
desired.
2. Otherwise stable and normal pelvic ultrasound.

## 2019-09-29 ENCOUNTER — Other Ambulatory Visit: Payer: Self-pay

## 2019-09-29 ENCOUNTER — Ambulatory Visit: Payer: 59 | Attending: Internal Medicine

## 2019-09-29 DIAGNOSIS — Z23 Encounter for immunization: Secondary | ICD-10-CM

## 2019-09-29 NOTE — Progress Notes (Signed)
   Covid-[facility] Vaccination [facility]  Name:  Kristine Hughes    MRN: [id number] DOB: August 27, 1970  09/29/2019  [name] was observed post Covid-19 immunization for 15 minutes without incident. She was provided with Vaccine Information Sheet and instruction to access the [employer]-Safe system.   [name] was instructed to call 911 with any severe reactions post vaccine: Marland Kitchen Difficulty breathing  . Swelling of face and throat  . A fast heartbeat  . A bad rash all over body  . Dizziness and weakness   Immunizations Administered    Name Date Dose VIS Date Route   Pfizer COVID-19 Vaccine 09/29/2019 10:21 AM 0.3 mL 05/30/2019 Intramuscular   Manufacturer: [employer], [employer]   Lot: [id number]   NDC: [address]-5

## 2019-10-21 ENCOUNTER — Ambulatory Visit: Payer: 59
